# Patient Record
Sex: Female | Born: 1975 | Race: White | Hispanic: No | Marital: Married | Smoking: Never smoker
Health system: Southern US, Community
[De-identification: ages and names within clinical notes are randomized; demographics above are authoritative.]

## PROBLEM LIST (undated history)

## (undated) DIAGNOSIS — Z9889 Other specified postprocedural states: Secondary | ICD-10-CM

## (undated) DIAGNOSIS — F419 Anxiety disorder, unspecified: Secondary | ICD-10-CM

## (undated) DIAGNOSIS — K219 Gastro-esophageal reflux disease without esophagitis: Secondary | ICD-10-CM

## (undated) DIAGNOSIS — F329 Major depressive disorder, single episode, unspecified: Secondary | ICD-10-CM

## (undated) DIAGNOSIS — F32A Depression, unspecified: Secondary | ICD-10-CM

## (undated) DIAGNOSIS — E079 Disorder of thyroid, unspecified: Secondary | ICD-10-CM

## (undated) DIAGNOSIS — R112 Nausea with vomiting, unspecified: Secondary | ICD-10-CM

## (undated) DIAGNOSIS — I10 Essential (primary) hypertension: Secondary | ICD-10-CM

## (undated) HISTORY — DX: Essential (primary) hypertension: I10

## (undated) HISTORY — DX: Gastro-esophageal reflux disease without esophagitis: K21.9

## (undated) HISTORY — PX: ABDOMINAL HYSTERECTOMY: SHX81

## (undated) HISTORY — PX: RECONSTRUCTION ADBOMINAL WALL: SUR1075

---

## 1998-12-30 ENCOUNTER — Other Ambulatory Visit: Admission: RE | Admit: 1998-12-30 | Discharge: 1998-12-30 | Payer: Self-pay | Admitting: *Deleted

## 1999-12-22 ENCOUNTER — Other Ambulatory Visit: Admission: RE | Admit: 1999-12-22 | Discharge: 1999-12-22 | Payer: Self-pay | Admitting: *Deleted

## 2001-02-10 ENCOUNTER — Encounter: Payer: Self-pay | Admitting: Family Medicine

## 2001-02-10 ENCOUNTER — Ambulatory Visit (HOSPITAL_COMMUNITY): Admission: RE | Admit: 2001-02-10 | Discharge: 2001-02-10 | Payer: Self-pay | Admitting: Family Medicine

## 2001-11-25 ENCOUNTER — Other Ambulatory Visit: Admission: RE | Admit: 2001-11-25 | Discharge: 2001-11-25 | Payer: Self-pay | Admitting: Obstetrics and Gynecology

## 2002-06-25 ENCOUNTER — Inpatient Hospital Stay (HOSPITAL_COMMUNITY): Admission: AD | Admit: 2002-06-25 | Discharge: 2002-06-28 | Payer: Self-pay | Admitting: Obstetrics and Gynecology

## 2002-06-25 ENCOUNTER — Encounter (INDEPENDENT_AMBULATORY_CARE_PROVIDER_SITE_OTHER): Payer: Self-pay | Admitting: *Deleted

## 2002-08-11 ENCOUNTER — Other Ambulatory Visit: Admission: RE | Admit: 2002-08-11 | Discharge: 2002-08-11 | Payer: Self-pay | Admitting: Obstetrics and Gynecology

## 2003-05-11 ENCOUNTER — Ambulatory Visit (HOSPITAL_COMMUNITY): Admission: RE | Admit: 2003-05-11 | Discharge: 2003-05-11 | Payer: Self-pay | Admitting: Family Medicine

## 2005-11-26 ENCOUNTER — Ambulatory Visit (HOSPITAL_COMMUNITY): Admission: RE | Admit: 2005-11-26 | Discharge: 2005-11-26 | Payer: Self-pay | Admitting: Family Medicine

## 2005-12-06 ENCOUNTER — Ambulatory Visit: Payer: Self-pay | Admitting: Orthopedic Surgery

## 2005-12-31 ENCOUNTER — Ambulatory Visit: Payer: Self-pay | Admitting: Orthopedic Surgery

## 2008-10-02 ENCOUNTER — Emergency Department (HOSPITAL_COMMUNITY): Admission: EM | Admit: 2008-10-02 | Discharge: 2008-10-02 | Payer: Self-pay | Admitting: Emergency Medicine

## 2009-08-10 ENCOUNTER — Ambulatory Visit (HOSPITAL_COMMUNITY): Admission: RE | Admit: 2009-08-10 | Discharge: 2009-08-11 | Payer: Self-pay | Admitting: Obstetrics and Gynecology

## 2009-08-10 ENCOUNTER — Encounter (INDEPENDENT_AMBULATORY_CARE_PROVIDER_SITE_OTHER): Payer: Self-pay | Admitting: Obstetrics and Gynecology

## 2010-03-17 LAB — CBC
HCT: 29.4 % — ABNORMAL LOW (ref 36.0–46.0)
HCT: 37.9 % (ref 36.0–46.0)
MCH: 26.9 pg (ref 26.0–34.0)
MCH: 27.6 pg (ref 26.0–34.0)
MCHC: 33.1 g/dL (ref 30.0–36.0)
MCV: 81.5 fL (ref 78.0–100.0)
Platelets: 218 10*3/uL (ref 150–400)
Platelets: 231 10*3/uL (ref 150–400)
RBC: 3.65 MIL/uL — ABNORMAL LOW (ref 3.87–5.11)
RBC: 4.65 MIL/uL (ref 3.87–5.11)
RDW: 16.2 % — ABNORMAL HIGH (ref 11.5–15.5)
RDW: 16.3 % — ABNORMAL HIGH (ref 11.5–15.5)

## 2010-04-06 LAB — URINALYSIS, ROUTINE W REFLEX MICROSCOPIC
Glucose, UA: NEGATIVE mg/dL
Hgb urine dipstick: NEGATIVE
Ketones, ur: NEGATIVE mg/dL

## 2010-04-06 LAB — POCT I-STAT, CHEM 8
Calcium, Ion: 1.1 mmol/L — ABNORMAL LOW (ref 1.12–1.32)
Chloride: 111 mEq/L (ref 96–112)
HCT: 40 % (ref 36.0–46.0)
Potassium: 4 mEq/L (ref 3.5–5.1)

## 2010-04-06 LAB — URINE CULTURE

## 2010-05-19 NOTE — H&P (Signed)
   NAME:  ERNESTO, LASHWAY                      ACCOUNT NO.:  [id number]   MEDICAL RECORD NO.:  [id number]                   PATIENT TYPE:  MAT   LOCATION:  [facility]                                 FACILITY:  [facility]   PHYSICIAN:  Lenoard Aden, M.D.             DATE OF BIRTH:  July 02, 1975   DATE OF ADMISSION:  06/25/2002  DATE OF DISCHARGE:                                HISTORY & PHYSICAL   CHIEF COMPLAINT:  1. Active labor.  2. Previous cesarean section x2 .   HISTORY OF PRESENT ILLNESS:  A 35 year old white female, G3, P2, EDD of July 06, 2002, at 38+ weeks in active labor with occasional variable  decelerations.   PAST MEDICAL HISTORY:  1. History of C-section x2.  2. Herniated disk with left leg numbness at L5 per patient report in 1998.   FAMILY HISTORY:  Anemia, UTI, and heart disease.   PRENATAL LABORATORY DATA:  Blood type A negative.  Rubella immune.  Hepatitis and HIV are negative.   PHYSICAL EXAMINATION:  GENERAL APPEARANCE:  She is a well-developed, well-  nourished, white female in a moderate amount of distress with her  contractions.  HEENT:  Normal.  LUNGS:  Clear.  HEART:  Regular rate and rhythm.  ABDOMEN:  Soft, gravid, and nontender.  PELVIC:  The cervix is now closed, 90% vertex, and -1.  EXTREMITIES:  No cords.  NEUROLOGIC:  Exam is nonfocal.   LABORATORY DATA:  NST tracing reveals fetal heart rates in the 140-150 beats  per minute range with accelerations and occasional variable decelerations  down to 90-100 are noted with contractions.   IMPRESSION:  1. A 38-week intrauterine pregnancy.  2. Previous cesarean sections x2 in early labor.  3. Intermittent variable decelerations.    PLAN:  Proceed with repeat C-section and tubal ligation.  The risks of  anesthesia, infection, bleeding, injury to abdominal organs, and need for  repair were discussed.  The failure risk of tubal ligation 5-10 per 1000  noted.  The patient acknowledges and wishes to  proceed.                                               Lenoard Aden, M.D.    RJT/MEDQ  D:  06/25/2002  T:  06/25/2002  Job:  [id number]

## 2010-05-19 NOTE — Op Note (Signed)
NAME:  RAYELLE, ARMOR                      ACCOUNT NO.:  [id number]   MEDICAL RECORD NO.:  [id number]                   PATIENT TYPE:  INP   LOCATION:  9133                                 FACILITY:  [facility]   PHYSICIAN:  Lenoard Aden, M.D.             DATE OF BIRTH:  [birth year]   DATE OF PROCEDURE:  06/25/2002  DATE OF DISCHARGE:                                 OPERATIVE REPORT   CHIEF COMPLAINT:  1. Previous cesarean section x2 in active labor.  2. Desire for elective sterilization.   POSTOPERATIVE DIAGNOSES:  1. Previous cesarean section x2 in active labor.  2. Desire for elective sterilization.   PROCEDURES:  1. Repeat low segment transverse cesarean section.  2. Bilateral partial salpingectomy.   SURGEON:  Lenoard Aden, M.D.   ANESTHESIA:  Spinal by Belva Agee, M.D.   ESTIMATED BLOOD LOSS:  700 mL.   IV FLUIDS:  1400 mL.   URINE OUTPUT:  200 mL clear.   INSTRUMENT COUNT:  Count.   DISPOSITION:  The patient in recovery in good condition.   FINDINGS:  Normal tubes, normal ovaries.  Full-term living female, Apgars of  9 and 9, 6 pounds 5 ounces.  Peds in attendance.   SPECIMENS:  Tubal segments x2 to pathology.   BRIEF OPERATIVE NOTE:  After being apprised of the risks of anesthesia,  infection, bleeding, injury to abdominal organs, need for repair, failure  risk of tubal ligation 5-10 per 1,000,the patient was brought to the  operating room where she was administered spinal anesthetic without  complications, prepped and draped in usual sterile fashion, a Foley catheter  placed.  After achieving adequate anesthesia, a dilute Marcaine solution was  placed in the area.  A Pfannenstiel skin incision was then made with a  scalpel, carried down to the fascia which was nicked in the midline and open  transversely using Mayo scissors.  Rectus muscles dissected sharply in the  midline.  The peritoneum entered sharply, a bladder blade placed.  The  visceral peritoneum was scored in a smile-like fashion.  The uterus was  scored in a smile-like fashion.  Amniotomy revealed clear fluid.  Atraumatic  delivery of full-term living female, 6 pounds and 9 ounces, handed to the  pediatricians in attendance, Apgars of 8 and 9.  The uterus is exteriorized.  The placenta delivered manually intact.  A three-vessel cord noted.  The  uterus curetted using a dry lap pack and found to be within normal limits.  At this time, normal tubes and ovaries had been previously noted.  Uterine  incision closed in one layer using 0 Monocryl in continuous running fashion.  The left tube traced out to the fimbriated end and __________ isthmic  portion of the tube is identified.  Avascular portion of the mesosalpinx is  cauterized creating a window whereby proximal and distal segments are ties  using the 0 plain suture.  Tubal  segment is excised.  Same procedure that is  done on the left tube is done on the right tube.  Both tubal segments sent  to pathology for confirmation.  Good hemostasis is noted.  Tubal lumens are  cauterized.  Visual inspection of the uterine incision finds good hemostasis  with an additional suture placed at the right lateral margin.  The bladder  flap is clear and well dissected off the lower uterine segment.  Paracolic  gutters irrigated and all blood clots subsequently removed.  The fascia then  closed using 0 Vicryl in continuous running fashion.  The skin closed using  staples.  The patient tolerated the procedure well and was transferred to  recovery in good condition.                                               Lenoard Aden, M.D.    RJT/MEDQ  D:  06/25/2002  T:  06/27/2002  Job:  [id number]

## 2010-05-19 NOTE — Discharge Summary (Signed)
   NAME:  Makayla Wilson, Makayla Wilson                      ACCOUNT NO.:  [id number]   MEDICAL RECORD NO.:  [id number]                   PATIENT TYPE:  INP   LOCATION:  9133                                 FACILITY:  [facility]   PHYSICIAN:  Lenoard Aden, M.D.             DATE OF BIRTH:  April 26, 1975   DATE OF ADMISSION:  06/25/2002  DATE OF DISCHARGE:  06/28/2002                                 DISCHARGE SUMMARY   DISCHARGE SUMMARY:  The patient underwent an uncomplicated repeat cesarean  section.  The postoperative course was uncomplicated.  Discharged to home on  day #3 on Tylox, Citracal and Motrin.  Discharge teaching done.  Follow up  in the office in 4-6 weeks.                                                Lenoard Aden, M.D.    RJT/MEDQ  D:  07/26/2002  T:  07/26/2002  Job:  [id number]

## 2012-04-29 ENCOUNTER — Other Ambulatory Visit (HOSPITAL_COMMUNITY): Payer: Self-pay | Admitting: Internal Medicine

## 2012-04-29 ENCOUNTER — Ambulatory Visit (HOSPITAL_COMMUNITY)
Admission: RE | Admit: 2012-04-29 | Discharge: 2012-04-29 | Disposition: A | Discharge status: Home / Self Care | Payer: BC Managed Care – PPO | Source: Ambulatory Visit | Attending: Internal Medicine | Admitting: Internal Medicine

## 2012-04-29 DIAGNOSIS — S9030XA Contusion of unspecified foot, initial encounter: Secondary | ICD-10-CM | POA: Insufficient documentation

## 2012-04-29 DIAGNOSIS — M79609 Pain in unspecified limb: Secondary | ICD-10-CM | POA: Insufficient documentation

## 2012-04-29 DIAGNOSIS — X58XXXA Exposure to other specified factors, initial encounter: Secondary | ICD-10-CM | POA: Insufficient documentation

## 2012-04-29 DIAGNOSIS — T1490XA Injury, unspecified, initial encounter: Secondary | ICD-10-CM

## 2012-08-20 ENCOUNTER — Other Ambulatory Visit (HOSPITAL_COMMUNITY): Payer: Self-pay | Admitting: Internal Medicine

## 2012-08-20 ENCOUNTER — Ambulatory Visit (HOSPITAL_COMMUNITY)
Admission: RE | Admit: 2012-08-20 | Discharge: 2012-08-20 | Disposition: A | Discharge status: Home / Self Care | Payer: BC Managed Care – PPO | Source: Ambulatory Visit | Attending: Internal Medicine | Admitting: Internal Medicine

## 2012-08-20 DIAGNOSIS — W19XXXA Unspecified fall, initial encounter: Secondary | ICD-10-CM

## 2012-08-20 DIAGNOSIS — M79609 Pain in unspecified limb: Secondary | ICD-10-CM | POA: Insufficient documentation

## 2013-01-08 ENCOUNTER — Emergency Department (HOSPITAL_COMMUNITY): Payer: BC Managed Care – PPO

## 2013-01-08 ENCOUNTER — Encounter (HOSPITAL_COMMUNITY): Payer: Self-pay | Admitting: Emergency Medicine

## 2013-01-08 ENCOUNTER — Emergency Department (HOSPITAL_COMMUNITY)
Admission: EM | Admit: 2013-01-08 | Discharge: 2013-01-08 | Disposition: A | Discharge status: Home / Self Care | Payer: BC Managed Care – PPO | Attending: Emergency Medicine | Admitting: Emergency Medicine

## 2013-01-08 DIAGNOSIS — R079 Chest pain, unspecified: Secondary | ICD-10-CM

## 2013-01-08 DIAGNOSIS — Z8659 Personal history of other mental and behavioral disorders: Secondary | ICD-10-CM | POA: Insufficient documentation

## 2013-01-08 DIAGNOSIS — I10 Essential (primary) hypertension: Secondary | ICD-10-CM | POA: Insufficient documentation

## 2013-01-08 DIAGNOSIS — R0602 Shortness of breath: Secondary | ICD-10-CM | POA: Insufficient documentation

## 2013-01-08 DIAGNOSIS — R110 Nausea: Secondary | ICD-10-CM | POA: Insufficient documentation

## 2013-01-08 DIAGNOSIS — R071 Chest pain on breathing: Secondary | ICD-10-CM | POA: Insufficient documentation

## 2013-01-08 HISTORY — DX: Anxiety disorder, unspecified: F41.9

## 2013-01-08 HISTORY — DX: Depression, unspecified: F32.A

## 2013-01-08 HISTORY — DX: Major depressive disorder, single episode, unspecified: F32.9

## 2013-01-08 LAB — CBC
HCT: 42.2 % (ref 36.0–46.0)
Hemoglobin: 14.5 g/dL (ref 12.0–15.0)
MCH: 30.4 pg (ref 26.0–34.0)
MCHC: 34.4 g/dL (ref 30.0–36.0)
MCV: 88.5 fL (ref 78.0–100.0)
Platelets: 229 10*3/uL (ref 150–400)
RBC: 4.77 MIL/uL (ref 3.87–5.11)
RDW: 13 % (ref 11.5–15.5)
WBC: 8.5 10*3/uL (ref 4.0–10.5)

## 2013-01-08 LAB — BASIC METABOLIC PANEL
BUN: 11 mg/dL (ref 6–23)
CALCIUM: 9.9 mg/dL (ref 8.4–10.5)
CO2: 27 mEq/L (ref 19–32)
Chloride: 92 mEq/L — ABNORMAL LOW (ref 96–112)
Creatinine, Ser: 0.69 mg/dL (ref 0.50–1.10)
GFR calc non Af Amer: >90 mL/min (ref >90)
GLUCOSE: 95 mg/dL (ref 70–99)
Potassium: 3.2 mEq/L — ABNORMAL LOW (ref 3.7–5.3)
Sodium: 133 mEq/L — ABNORMAL LOW (ref 137–147)

## 2013-01-08 LAB — TROPONIN I

## 2013-01-08 MED ORDER — SUCRALFATE 1 G PO TABS: 1.0000 g | ORAL_TABLET | Freq: Four times a day (QID) | ORAL | Status: DC

## 2013-01-08 MED ORDER — GI COCKTAIL ~~LOC~~
30.0000 mL | Freq: Once | ORAL | Status: AC
  Administered 2013-01-08: 30 mL via ORAL

## 2013-01-08 MED ORDER — OMEPRAZOLE 20 MG PO CPDR: 20.0000 mg | DELAYED_RELEASE_CAPSULE | Freq: Every day | ORAL | Status: DC

## 2013-01-08 MED ORDER — FENTANYL CITRATE 0.05 MG/ML IJ SOLN
50.0000 ug | Freq: Once | INTRAMUSCULAR | Status: AC
  Administered 2013-01-08: 50 ug via INTRAVENOUS
  Filled 2013-01-08: qty 2

## 2013-01-08 MED ORDER — KETOROLAC TROMETHAMINE 30 MG/ML IJ SOLN
30.0000 mg | Freq: Once | INTRAMUSCULAR | Status: AC
  Administered 2013-01-08: 30 mg via INTRAVENOUS

## 2013-01-08 MED ORDER — FAMOTIDINE IN NACL 20-0.9 MG/50ML-% IV SOLN
20.0000 mg | Freq: Once | INTRAVENOUS | Status: AC
  Administered 2013-01-08: 20 mg via INTRAVENOUS

## 2013-01-08 MED ORDER — SODIUM CHLORIDE 0.9 % IV SOLN
Freq: Once | INTRAVENOUS | Status: AC
  Administered 2013-01-08: 18:00:00 via INTRAVENOUS

## 2013-01-08 NOTE — ED Provider Notes (Signed)
CSN: [id number]     Arrival date & time 01/08/13  1712 History  This chart was scribed for Gerhard Munchobert Rodderick Holtzer, MD by Dorothey Basemania Sutton, ED Scribe. This patient was seen in room APA02/APA02 and the patient's care was started at 5:37 PM.    Chief Complaint  Patient presents with  . Chest Pain   The history is provided by the patient. No language interpreter was used.   HPI Comments: Makayla Wilson is a 38 y.o. female who presents to the Emergency Department complaining of sudden onset chest pain that extends from the epigastric region up to her throat around 2.5 hours ago while she was at work. She states that the chest pain has not changed since onset and denies any exacerbating or alleviating factors. She reports some associated nausea and mild shortness of breath. She denies emesis, rash, abdominal pain, fever. Patient has a history of HTN and anxiety. Patient reports a familial history of MI. Patient is a non-smoker.  Past Medical History  Diagnosis Date  . Hypertension   . Anxiety   . Depression    Past Surgical History  Procedure Laterality Date  . Cesarean section    . Abdominal hysterectomy     History reviewed. No pertinent family history. History  Substance Use Topics  . Smoking status: Never Smoker   . Smokeless tobacco: Not on file  . Alcohol Use: Yes   OB History   Grav Para Term Preterm Abortions TAB SAB Ect Mult Living                 Review of Systems  Constitutional: Negative for fever.       Per HPI, otherwise negative  HENT:       Per HPI, otherwise negative  Respiratory: Positive for shortness of breath.        Per HPI, otherwise negative  Cardiovascular: Positive for chest pain.       Per HPI, otherwise negative  Gastrointestinal: Positive for nausea. Negative for vomiting and abdominal pain.  Endocrine:       Negative aside from HPI  Genitourinary:       Neg aside from HPI   Musculoskeletal:       Per HPI, otherwise negative  Skin: Negative.  Negative  for rash.  Neurological: Negative for syncope.    Allergies  Morphine and related  Home Medications  No current outpatient prescriptions on file.  Triage Vitals: BP 119/73  Pulse 105  Temp(Src) 97.3 F (36.3 C) (Oral)  Resp 18  Ht 5\' 1"  (1.549 m)  Wt 155 lb (70.308 kg)  BMI 29.30 kg/m2  SpO2 100%  Physical Exam  Nursing note and vitals reviewed. Constitutional: She is oriented to person, place, and time. She appears well-developed and well-nourished. No distress.  HENT:  Head: Normocephalic and atraumatic.  Eyes: Conjunctivae and EOM are normal.  Cardiovascular: Normal rate, regular rhythm and normal heart sounds.   Pulmonary/Chest: Effort normal and breath sounds normal. No stridor. No respiratory distress. She exhibits tenderness.  Reproducible tenderness to palpation to chest wall.   Abdominal: Soft. She exhibits no distension. There is no tenderness.  Musculoskeletal: She exhibits no edema.  Neurological: She is alert and oriented to person, place, and time. No cranial nerve deficit.  Skin: Skin is warm and dry.  Psychiatric: She has a normal mood and affect.    ED Course  Procedures (including critical care time)  DIAGNOSTIC STUDIES: Oxygen Saturation is 100% on room air, normal by my  interpretation.    COORDINATION OF CARE: 5:40 PM- Ordered blood labs and a chest x-ray. Will order IV fluids, Toradol, Pepcid, and a GI cocktail to manage symptoms. Discussed treatment plan with patient at bedside and patient verbalized agreement.   6:33 PM- Patient reports that the pain has somewhat improved, but is still intermittent. Will order Sumblimaze to manage symptoms. Will order an additional troponin. Discussed that preliminary lab and imaging results were normal. Discussed treatment plan with patient at bedside and patient verbalized agreement.   10:01 PM- Discussed that EKG and lab results were normal. Will discharge patient with medication to manage symptoms. Discussed  that the patient is relatively low risk for cardiac problems at this time. Advised patient to follow up with her PCP for further evaluation. Discussed treatment plan with patient at bedside and patient verbalized agreement.   Labs Review Labs Reviewed  BASIC METABOLIC PANEL - Abnormal; Notable for the following:    Sodium 133 (*)    Potassium 3.2 (*)    Chloride 92 (*)    All other components within normal limits  CBC  TROPONIN I  TROPONIN I   Imaging Review Dg Chest 2 View  01/08/2013   CLINICAL DATA:  Chest pain.  EXAM: CHEST  2 VIEW  COMPARISON:  None.  FINDINGS: The heart size and mediastinal contours are within normal limits. Both lungs are clear. The visualized skeletal structures are unremarkable.  IMPRESSION: No active cardiopulmonary disease.   Electronically Signed   By: Salome Holmes M.D.   On: 01/08/2013 17:58    EKG Interpretation    Date/Time:  Thursday January 08 2013 17:18:34 EST Ventricular Rate:  94 PR Interval:  122 QRS Duration: 94 QT Interval:  370 QTC Calculation: 462 R Axis:   51 Text Interpretation:  Normal sinus rhythm Normal ECG No previous ECGs available Sinus rhythm Normal ECG Confirmed by Gerhard Munch  MD ([id number]) on 01/08/2013 5:25:30 PM           EKG Interpretation    Date/Time:  Thursday January 08 2013 21:20:46 EST Ventricular Rate:  68 PR Interval:  128 QRS Duration: 92 QT Interval:  440 QTC Calculation: 467 R Axis:   61 Text Interpretation:  Normal sinus rhythm Normal ECG When compared with ECG of 08-Jan-2013 17:18, No significant change was found Sinus rhythm No significant change since last tracing Normal ECG Confirmed by Gerhard Munch  MD ([id number]) on 01/08/2013 10:12:31 PM            MDM   1. Chest pain    patient presents with pain, nausea.  Patient's description of pain from the epigastrium radiating superiorly, the absence of EKG changes, the serially negative troponins, and her generally low risk profile are all  reassuring for the low suspicion of ongoing coronary ischemia. Patient had pain resolution here.  She is appropriate for discharge with close outpatient followup.    Gerhard Munch, MD 01/08/13 2214

## 2013-01-08 NOTE — Discharge Instructions (Signed)
As discussed, it is important that you follow up as soon as possible with your physician for continued management of your condition. ° °If you develop any new, or concerning changes in your condition, please return to the emergency department immediately. ° °Chest Pain (Nonspecific) °It is often hard to give a specific diagnosis for the cause of chest pain. There is always a chance that your pain could be related to something serious, such as a heart attack or a blood clot in the lungs. You need to follow up with your caregiver for further evaluation. °CAUSES  °· Heartburn. °· Pneumonia or bronchitis. °· Anxiety or stress. °· Inflammation around your heart (pericarditis) or lung (pleuritis or pleurisy). °· A blood clot in the lung. °· A collapsed lung (pneumothorax). It can develop suddenly on its own (spontaneous pneumothorax) or from injury (trauma) to the chest. °· Shingles infection (herpes zoster virus). °The chest wall is composed of bones, muscles, and cartilage. Any of these can be the source of the pain. °· The bones can be bruised by injury. °· The muscles or cartilage can be strained by coughing or overwork. °· The cartilage can be affected by inflammation and become sore (costochondritis). °DIAGNOSIS  °Lab tests or other studies, such as X-rays, electrocardiography, stress testing, or cardiac imaging, may be needed to find the cause of your pain.  °TREATMENT  °· Treatment depends on what may be causing your chest pain. Treatment may include: °· Acid blockers for heartburn. °· Anti-inflammatory medicine. °· Pain medicine for inflammatory conditions. °· Antibiotics if an infection is present. °· You may be advised to change lifestyle habits. This includes stopping smoking and avoiding alcohol, caffeine, and chocolate. °· You may be advised to keep your head raised (elevated) when sleeping. This reduces the chance of acid going backward from your stomach into your esophagus. °· Most of the time, nonspecific  chest pain will improve within 2 to 3 days with rest and mild pain medicine. °HOME CARE INSTRUCTIONS  °· If antibiotics were prescribed, take your antibiotics as directed. Finish them even if you start to feel better. °· For the next few days, avoid physical activities that bring on chest pain. Continue physical activities as directed. °· Do not smoke. °· Avoid drinking alcohol. °· Only take over-the-counter or prescription medicine for pain, discomfort, or fever as directed by your caregiver. °· Follow your caregiver's suggestions for further testing if your chest pain does not go away. °· Keep any follow-up appointments you made. If you do not go to an appointment, you could develop lasting (chronic) problems with pain. If there is any problem keeping an appointment, you must call to reschedule. °SEEK MEDICAL CARE IF:  °· You think you are having problems from the medicine you are taking. Read your medicine instructions carefully. °· Your chest pain does not go away, even after treatment. °· You develop a rash with blisters on your chest. °SEEK IMMEDIATE MEDICAL CARE IF:  °· You have increased chest pain or pain that spreads to your arm, neck, jaw, back, or abdomen. °· You develop shortness of breath, an increasing cough, or you are coughing up blood. °· You have severe back or abdominal pain, feel nauseous, or vomit. °· You develop severe weakness, fainting, or chills. °· You have a fever. °THIS IS AN EMERGENCY. Do not wait to see if the pain will go away. Get medical help at once. Call your local emergency services (911 in [place] Do not drive yourself to the hospital. °  MAKE SURE YOU:  °· Understand these instructions. °· Will watch your condition. °· Will get help right away if you are not doing well or get worse. °Document Released: 09/27/2004 Document Revised: 03/12/2011 Document Reviewed: 07/24/2007 °[employer]® Patient Information ©2014 [employer]. ° °

## 2013-01-08 NOTE — ED Notes (Signed)
Chest pain, onset 3 pm, nausea, and sob,

## 2013-02-05 DIAGNOSIS — I10 Essential (primary) hypertension: Secondary | ICD-10-CM

## 2013-02-05 DIAGNOSIS — K224 Dyskinesia of esophagus: Secondary | ICD-10-CM

## 2013-02-05 DIAGNOSIS — F32A Depression, unspecified: Secondary | ICD-10-CM | POA: Insufficient documentation

## 2013-02-05 DIAGNOSIS — F3289 Other specified depressive episodes: Secondary | ICD-10-CM

## 2013-02-05 DIAGNOSIS — F329 Major depressive disorder, single episode, unspecified: Secondary | ICD-10-CM

## 2013-02-05 DIAGNOSIS — K219 Gastro-esophageal reflux disease without esophagitis: Secondary | ICD-10-CM

## 2013-02-05 DIAGNOSIS — E669 Obesity, unspecified: Secondary | ICD-10-CM

## 2013-02-06 ENCOUNTER — Encounter: Payer: Self-pay | Admitting: Cardiology

## 2013-02-06 ENCOUNTER — Ambulatory Visit (INDEPENDENT_AMBULATORY_CARE_PROVIDER_SITE_OTHER): Payer: BC Managed Care – PPO | Admitting: Cardiology

## 2013-02-06 VITALS — BP 130/82 | HR 81 | Ht 61.0 in | Wt 167.0 lb

## 2013-02-06 DIAGNOSIS — K219 Gastro-esophageal reflux disease without esophagitis: Secondary | ICD-10-CM

## 2013-02-06 DIAGNOSIS — R072 Precordial pain: Secondary | ICD-10-CM | POA: Insufficient documentation

## 2013-02-06 DIAGNOSIS — I10 Essential (primary) hypertension: Secondary | ICD-10-CM

## 2013-02-06 NOTE — Assessment & Plan Note (Signed)
Potential diagnosis. She is on a proton pump inhibitor and Carafate.

## 2013-02-06 NOTE — Patient Instructions (Addendum)
Your physician recommends that you schedule a follow-up appointment to be determined.we will call you with test results.   Your physician has requested that you have a stress echocardiogram. For further information please visit [email or web address]. Please follow instruction sheet as given.

## 2013-02-06 NOTE — Progress Notes (Signed)
Clinical Summary Makayla Wilson is a 38 y.o.female referred for cardiology consultation by Dr. Margo Aye. She was seen in the ER back in January with chest pain, ultimately discharged to followup with her primary care provider.  Lab work from January showed normal troponin I, hemoglobin 14.5, platelets 229, potassium 3.2, BUN 11, creatinine 0.6. ECG from that time showed normal sinus rhythm. Chest x-ray demonstrated no active cardiac pulmonary disease.  She is here with her husband today. She reports a 6-12 month history of intermittent chest tightness. She states these episodes have become somewhat more intense and frequent in the last 6 months. These or not exertional typically, more sporadic, can last for several minutes or even longer. She has taken one of her mother's nitroglycerin pills on one occasion and did note some improvement. Does not endorse any regular reflux symptoms or dysphasia. She states she is active, has been trying to exercise and watch her diet given family history of premature CAD in both parents.  She does have a history of hypertension, reports compliance with her medication.  She has not undergone any prior ischemic cardiac testing.  Allergies  Allergen Reactions  . Morphine And Related     Current Outpatient Prescriptions  Medication Sig Dispense Refill  . cephALEXin (KEFLEX) 500 MG capsule Take 500 mg by mouth 3 (three) times daily.      Marland Kitchen escitalopram (LEXAPRO) 20 MG tablet Take 20 mg by mouth daily.      Marland Kitchen olmesartan-hydrochlorothiazide (BENICAR HCT) 20-12.5 MG per tablet Take 1 tablet by mouth daily.      . Omega-3 Fatty Acids (FISH OIL) 1000 MG CAPS Take 1,000 mg by mouth daily.      Marland Kitchen omeprazole (PRILOSEC) 20 MG capsule Take 1 capsule (20 mg total) by mouth daily.  30 capsule  0  . sucralfate (CARAFATE) 1 G tablet Take 1 tablet (1 g total) by mouth 4 (four) times daily.  28 tablet  0   No current facility-administered medications for this visit.    Past  Medical History  Diagnosis Date  . Essential hypertension, benign   . Anxiety   . Depression   . GERD (gastroesophageal reflux disease)     Past Surgical History  Procedure Laterality Date  . Cesarean section    . Abdominal hysterectomy      Family History  Problem Relation Age of Onset  . CAD Father     Premature  . CAD Mother     Premature    Social History Makayla Wilson reports that she has never smoked. She does not have any smokeless tobacco history on file. Makayla Wilson reports that she drinks alcohol.  Review of Systems No palpitations or syncope. She enjoys scuba diving, sometimes feels dizzy when she dies. Bilateral knee pain sometimes with exercise, no claudication. Otherwise negative.  Physical Examination Filed Vitals:   02/06/13 1326  BP: 130/82  Pulse: 81   Filed Weights   02/06/13 1326  Weight: 167 lb (75.751 kg)   Patient appears comfortable at rest. HEENT: Conjunctiva and lids normal, oropharynx clear with moist mucosa. Neck: Supple, no elevated JVP or carotid bruits, no thyromegaly. Lungs: Clear to auscultation, nonlabored breathing at rest. Cardiac: Regular rate and rhythm, no S3 or significant systolic murmur, no pericardial rub. Extremities: No pitting edema, distal pulses 2+. Skin: Warm and dry. Musculoskeletal: No kyphosis. Neuropsychiatric: Alert and oriented x3, affect grossly appropriate.   Problem List and Plan   Precordial pain Typical and atypical features.  Cardiac risk factors include personal history of hypertension, also significant family history of premature CAD in both parents. She reports generally favorable lipid panel, no medical therapy for hyperlipidemia. Baseline ECG is normal. Recent lab work reviewed as well. Plan will be to proceed with exercise echocardiogram for further evaluation. We will inform her of the results.  GERD (gastroesophageal reflux disease) Potential diagnosis. She is on a proton pump inhibitor and  Carafate.  Essential hypertension, benign Reports compliance with antihypertensive treatment, also working on diet and exercise.    Jonelle SidleSamuel G. Antonino Nienhuis, M.D., F.A.C.C.

## 2013-02-06 NOTE — Assessment & Plan Note (Signed)
Reports compliance with antihypertensive treatment, also working on diet and exercise.

## 2013-02-06 NOTE — Assessment & Plan Note (Signed)
Typical and atypical features. Cardiac risk factors include personal history of hypertension, also significant family history of premature CAD in both parents. She reports generally favorable lipid panel, no medical therapy for hyperlipidemia. Baseline ECG is normal. Recent lab work reviewed as well. Plan will be to proceed with exercise echocardiogram for further evaluation. We will inform her of the results.

## 2013-02-12 ENCOUNTER — Encounter (HOSPITAL_COMMUNITY): Payer: Self-pay | Admitting: Pharmacy Technician

## 2013-02-12 ENCOUNTER — Encounter (HOSPITAL_COMMUNITY): Payer: Self-pay

## 2013-02-12 ENCOUNTER — Encounter (HOSPITAL_COMMUNITY)
Admission: RE | Admit: 2013-02-12 | Discharge: 2013-02-12 | Disposition: A | Discharge status: Home / Self Care | Payer: BC Managed Care – PPO | Source: Ambulatory Visit | Attending: General Surgery | Admitting: General Surgery

## 2013-02-12 LAB — BASIC METABOLIC PANEL
BUN: 12 mg/dL (ref 6–23)
CALCIUM: 9.6 mg/dL (ref 8.4–10.5)
CO2: 29 mEq/L (ref 19–32)
Chloride: 98 mEq/L (ref 96–112)
Creatinine, Ser: 0.73 mg/dL (ref 0.50–1.10)
Glucose, Bld: 84 mg/dL (ref 70–99)
Potassium: 3.8 mEq/L (ref 3.7–5.3)
Sodium: 140 mEq/L (ref 137–147)

## 2013-02-12 LAB — CBC WITH DIFFERENTIAL/PLATELET
BASOS ABS: 0 10*3/uL (ref 0.0–0.1)
Basophils Relative: 1 % (ref 0–1)
EOS ABS: 0.2 10*3/uL (ref 0.0–0.7)
Eosinophils Relative: 4 % (ref 0–5)
HCT: 41.7 % (ref 36.0–46.0)
Hemoglobin: 14.4 g/dL (ref 12.0–15.0)
Lymphocytes Relative: 33 % (ref 12–46)
Lymphs Abs: 2.2 10*3/uL (ref 0.7–4.0)
MCH: 31 pg (ref 26.0–34.0)
MCHC: 34.5 g/dL (ref 30.0–36.0)
MCV: 89.7 fL (ref 78.0–100.0)
Monocytes Absolute: 0.3 10*3/uL (ref 0.1–1.0)
Monocytes Relative: 5 % (ref 3–12)
NEUTROS ABS: 3.9 10*3/uL (ref 1.7–7.7)
Neutrophils Relative %: 59 % (ref 43–77)
PLATELETS: 266 10*3/uL (ref 150–400)
RBC: 4.65 MIL/uL (ref 3.87–5.11)
RDW: 13.1 % (ref 11.5–15.5)
WBC: 6.6 10*3/uL (ref 4.0–10.5)

## 2013-02-12 NOTE — H&P (Signed)
  NTS SOAP Note  Vital Signs:  Vitals as of: 02/12/2013: Systolic 106: Diastolic 70: Heart Rate 78: Temp 99.29F: Height 655ft 1in: Weight 159Lbs 0 Ounces: Pain Level 8: BMI 30.04  BMI : 30.04 kg/m2  Subjective: This 38 Years 38 Months old Female presents for of a sebaceous cyst on abdominal wall.  Has been present for one months.  Some drainage noted, but  pain and swelling persists despite being on antibiotic.  Review of Symptoms:  Constitutional:  fever,chills    headache :   Nose/Mouth/Throat:No nasal congestion, rhinorrhea, oral lesions, postnasal drip or sore throat  Cardiovascular:  unremarkable   Respiratory:unremarkable   Gastrointestin    abdominal pain,nausea Genitourinary:unremarkable       joint pain boils Hematolgic/Lymphatic:unremarkable    ...   hay fever   Past Medical History:    Reviewed  Past Medical History  Surgical History: c sections, abdominoplasty, hysterectomy Medical Problems: HTN, depression Allergies: morphine, j and j bandaids Medications: lexapro, benecar, pepcid, bactrum   Social History:Reviewed  Social History  Preferred Language: English Race:  White Ethnicity: Not Hispanic / Latino Age: 38 Years 4 Months Marital Status:  M Alcohol: socially Recreational drug(s): no   Smoking Status: Never smoker reviewed on 02/12/2013 Functional Status reviewed on 02/12/2013 ------------------------------------------------ Bathing: Normal Cooking: Normal Dressing: Normal Driving: Normal Eating: Normal Managing Meds: Normal Oral Care: Normal Shopping: Normal Toileting: Normal Transferring: Normal Walking: Normal Cognitive Status reviewed on 02/12/2013 ------------------------------------------------ Attention: Normal Decision Making: Normal Language: Normal Memory: Normal Motor: Normal Perception: Normal Problem Solving: Normal Visual and Spatial: Normal   Family History:   Reviewed  Family Health History Mother, Living; Diabetes mellitus, unspecified type; Heart disease;  Father, Deceased; Heart disease;     Objective Information: General:  Well appearing, well nourished in no distress. Heart:  RRR, no murmur Lungs:    CTA bilaterally, no wheezes, rhonchi, rales.  Breathing unlabored. Abdomen:Soft, NT/ND, no HSM, no masses.  2.5cm inflamed cyst on left upper abdominal wall.  Punctum present.  Erythematous.  Assessment:Sebaceous cyst abdominal wall, inflamed  Diagnoses: 706.2 Epidermoid cyst of skin (Sebaceous cyst)  Procedures: [id number] - OFFICE OUTPATIENT NEW 30 MINUTES    Plan:  Scheduled for excision of sebaceous cyst, abdominal wall on 02/16/13.   Patient Education:Alternative treatments to surgery were discussed with patient (and family).  Risks and benefits  of procedure were fully explained to the patient (and family) who gave informed consent. Patient/family questions were addressed.  Follow-up:Pending Surgery

## 2013-02-12 NOTE — Patient Instructions (Addendum)
Makayla SloughMelissa F Wilson  02/12/2013   Your procedure is scheduled on:   02/16/2013  Report to [facility] at  725  AM.  Call this number if you have problems the morning of surgery: [phone]   Remember:   Do not eat food or drink liquids after midnight.   Take these medicines the morning of surgery with A SIP OF WATER: lexapro, benicar, prilosec   Do not wear jewelry, make-up or nail polish.  Do not wear lotions, powders, or perfumes.   Do not shave 48 hours prior to surgery. Men may shave face and neck.  Do not bring valuables to the hospital.  [facility] is not responsible  for any belongings or valuables.               Contacts, dentures or bridgework may not be worn into surgery.  Leave suitcase in the car. After surgery it may be brought to your room.  For patients admitted to the hospital, discharge time is determined by your treatment team.               Patients discharged the day of surgery will not be allowed to drive home.  Name and phone number of your driver: family  Special Instructions: Shower using CHG 2 nights before surgery and the night before surgery.  If you shower the day of surgery use CHG.  Use special wash - you have one bottle of CHG for all showers.  You should use approximately 1/3 of the bottle for each shower.   Please read over the following fact sheets that you were given: Pain Booklet, Coughing and Deep Breathing, Surgical Site Infection Prevention, Anesthesia Post-op Instructions and Care and Recovery After Surgery Cyst Removal Your caregiver has removed a cyst. A cyst is a sac containing a semi-solid material. Cysts may occur any place on your body. They may remain small for years or gradually get larger. A sebaceous cyst is an enlarged (dilated) sweat gland filled with old sweat (sebum). Unattended, these may become large (the size of a softball) over several years time. These are often removed for improved appearance (cosmetic) reasons or before  they become infected to form an abscess. An abscess is an infected cyst. HOME CARE INSTRUCTIONS   Keep your bandage clean and dry. You may change your bandage after 24 hours. If your bandage sticks, use warm water to gently loosen it. Pat the area dry with a clean towel before putting on another bandage.  If possible, keep the area where the cyst was removed raised to relieve soreness, swelling, and promote healing.  If you have stitches, keep them clean and dry.  You may clean your stitches gently with a cotton swab dipped in warm soapy water.  Do not soak the area where the cyst was removed or go swimming. You may shower.  Do not overuse the area where your cyst was removed.  Return in 7 days or as directed to have your stitches removed.  Take medicines as instructed by your caregiver. SEEK IMMEDIATE MEDICAL CARE IF:   An oral temperature above 102 F (38.9 C) develops, not controlled by medication.  Blood continues to soak through the bandage.  You have increasing pain in the area where your cyst was removed.  You have redness, swelling, pus, a bad smell, soreness (inflammation), or red streaks coming away from the stitches. These are signs of infection. MAKE SURE YOU:   Understand these instructions.  Will  watch your condition.  Will get help right away if you are not doing well or get worse. Document Released: 12/16/1999 Document Revised: 03/12/2011 Document Reviewed: 04/10/2007 [facility] Patient Information 2014 [place], [place]. PATIENT INSTRUCTIONS POST-ANESTHESIA  IMMEDIATELY FOLLOWING SURGERY:  Do not drive or operate machinery for the first twenty four hours after surgery.  Do not make any important decisions for twenty four hours after surgery or while taking narcotic pain medications or sedatives.  If you develop intractable nausea and vomiting or a severe headache please notify your doctor immediately.  FOLLOW-UP:  Please make an appointment with your surgeon as  instructed. You do not need to follow up with anesthesia unless specifically instructed to do so.  WOUND CARE INSTRUCTIONS (if applicable):  Keep a dry clean dressing on the anesthesia/puncture wound site if there is drainage.  Once the wound has quit draining you may leave it open to air.  Generally you should leave the bandage intact for twenty four hours unless there is drainage.  If the epidural site drains for more than 36-48 hours please call the anesthesia department.  QUESTIONS?:  Please feel free to call your physician or the hospital operator if you have any questions, and they will be happy to assist you.

## 2013-02-13 ENCOUNTER — Encounter (HOSPITAL_COMMUNITY): Payer: Self-pay

## 2013-02-13 ENCOUNTER — Ambulatory Visit (HOSPITAL_COMMUNITY)
Admission: RE | Admit: 2013-02-13 | Discharge: 2013-02-13 | Disposition: A | Discharge status: Home / Self Care | Payer: BC Managed Care – PPO | Source: Ambulatory Visit | Attending: Cardiology | Admitting: Cardiology

## 2013-02-13 DIAGNOSIS — R072 Precordial pain: Secondary | ICD-10-CM

## 2013-02-13 DIAGNOSIS — I10 Essential (primary) hypertension: Secondary | ICD-10-CM | POA: Insufficient documentation

## 2013-02-13 NOTE — Progress Notes (Signed)
Stress Lab Nurses Notes - Makayla Wilson  Makayla Wilson 02/13/2013 Reason for doing test: Chest Pain Type of test: Stress Echo Nurse performing test: Parke PoissonPhyllis Billingsly, RN Nuclear Medicine Tech: Not Applicable Echo Tech: Nestor RampSharika Roberts MD performing test: P. Nishan/K.Lawrence NP Family MD: Margo AyeHall Test explained and consent signed: yes IV started: No IV started Symptoms: Dizziness & fatigue Treatment/Intervention: None Reason test stopped: fatigue and reached target HR After recovery IV was: NA Patient to return to Nuc. Med at : NA Patient discharged: Home Patient's Condition upon discharge was: stable Comments: During test peak BP 129/68 & HR 171.  Recovery BP 119/69 & HR 102.  Symptoms resolved in recovery. Erskine SpeedBillingsley, Thuan Tippett T

## 2013-02-16 ENCOUNTER — Encounter (HOSPITAL_COMMUNITY): Payer: Self-pay | Admitting: *Deleted

## 2013-02-16 ENCOUNTER — Ambulatory Visit (HOSPITAL_COMMUNITY): Payer: BC Managed Care – PPO | Admitting: Anesthesiology

## 2013-02-16 ENCOUNTER — Ambulatory Visit (HOSPITAL_COMMUNITY)
Admission: RE | Admit: 2013-02-16 | Discharge: 2013-02-16 | Disposition: A | Discharge status: Home / Self Care | Payer: BC Managed Care – PPO | Source: Ambulatory Visit | Attending: General Surgery | Admitting: General Surgery

## 2013-02-16 ENCOUNTER — Encounter (HOSPITAL_COMMUNITY): Payer: BC Managed Care – PPO | Admitting: Anesthesiology

## 2013-02-16 ENCOUNTER — Encounter (HOSPITAL_COMMUNITY)
Admission: RE | Disposition: A | Discharge status: Home / Self Care | Payer: Self-pay | Source: Ambulatory Visit | Attending: General Surgery

## 2013-02-16 DIAGNOSIS — F329 Major depressive disorder, single episode, unspecified: Secondary | ICD-10-CM | POA: Insufficient documentation

## 2013-02-16 DIAGNOSIS — Z79899 Other long term (current) drug therapy: Secondary | ICD-10-CM | POA: Insufficient documentation

## 2013-02-16 DIAGNOSIS — F3289 Other specified depressive episodes: Secondary | ICD-10-CM | POA: Insufficient documentation

## 2013-02-16 DIAGNOSIS — L723 Sebaceous cyst: Secondary | ICD-10-CM | POA: Insufficient documentation

## 2013-02-16 DIAGNOSIS — I10 Essential (primary) hypertension: Secondary | ICD-10-CM | POA: Insufficient documentation

## 2013-02-16 DIAGNOSIS — Z6830 Body mass index (BMI) 30.0-30.9, adult: Secondary | ICD-10-CM | POA: Insufficient documentation

## 2013-02-16 HISTORY — DX: Nausea with vomiting, unspecified: R11.2

## 2013-02-16 HISTORY — DX: Other specified postprocedural states: Z98.890

## 2013-02-16 HISTORY — PX: CYST REMOVAL TRUNK: SHX6283

## 2013-02-16 SURGERY — CYST REMOVAL TRUNK
Anesthesia: General | Site: Abdomen

## 2013-02-16 MED ORDER — MIDAZOLAM HCL 2 MG/2ML IJ SOLN
INTRAMUSCULAR | Status: AC
  Filled 2013-02-16: qty 2

## 2013-02-16 MED ORDER — DEXAMETHASONE SODIUM PHOSPHATE 4 MG/ML IJ SOLN
4.0000 mg | Freq: Once | INTRAMUSCULAR | Status: AC
  Administered 2013-02-16: 4 mg via INTRAVENOUS

## 2013-02-16 MED ORDER — FENTANYL CITRATE 0.05 MG/ML IJ SOLN: 25.0000 ug | INTRAMUSCULAR | Status: DC | PRN

## 2013-02-16 MED ORDER — PROPOFOL 10 MG/ML IV BOLUS
INTRAVENOUS | Status: DC | PRN
  Administered 2013-02-16: 150 mg via INTRAVENOUS

## 2013-02-16 MED ORDER — ONDANSETRON HCL 4 MG/2ML IJ SOLN
4.0000 mg | Freq: Once | INTRAMUSCULAR | Status: AC
  Administered 2013-02-16: 4 mg via INTRAVENOUS

## 2013-02-16 MED ORDER — FENTANYL CITRATE 0.05 MG/ML IJ SOLN
INTRAMUSCULAR | Status: AC
  Filled 2013-02-16: qty 2

## 2013-02-16 MED ORDER — VANCOMYCIN HCL IN DEXTROSE 1-5 GM/200ML-% IV SOLN
INTRAVENOUS | Status: AC
  Filled 2013-02-16: qty 200

## 2013-02-16 MED ORDER — SCOPOLAMINE 1 MG/3DAYS TD PT72
1.0000 | MEDICATED_PATCH | Freq: Once | TRANSDERMAL | Status: DC
  Administered 2013-02-16: 1.5 mg via TRANSDERMAL

## 2013-02-16 MED ORDER — OXYCODONE-ACETAMINOPHEN 7.5-325 MG PO TABS: 1.0000 | ORAL_TABLET | ORAL | Status: DC | PRN

## 2013-02-16 MED ORDER — SCOPOLAMINE 1 MG/3DAYS TD PT72
MEDICATED_PATCH | TRANSDERMAL | Status: AC
  Filled 2013-02-16: qty 1

## 2013-02-16 MED ORDER — CHLORHEXIDINE GLUCONATE 4 % EX LIQD: 1.0000 "application " | Freq: Once | CUTANEOUS | Status: DC

## 2013-02-16 MED ORDER — DEXTROSE 5 % IV SOLN
INTRAVENOUS | Status: DC | PRN
  Administered 2013-02-16: 09:00:00 via INTRAVENOUS

## 2013-02-16 MED ORDER — GLYCOPYRROLATE 0.2 MG/ML IJ SOLN
0.2000 mg | Freq: Once | INTRAMUSCULAR | Status: AC
  Administered 2013-02-16: 0.2 mg via INTRAVENOUS
  Filled 2013-02-16: qty 1

## 2013-02-16 MED ORDER — MIDAZOLAM HCL 2 MG/2ML IJ SOLN
1.0000 mg | INTRAMUSCULAR | Status: DC | PRN
  Administered 2013-02-16 (×2): 2 mg via INTRAVENOUS
  Filled 2013-02-16: qty 2

## 2013-02-16 MED ORDER — FENTANYL CITRATE 0.05 MG/ML IJ SOLN
INTRAMUSCULAR | Status: DC | PRN
Start: 2013-02-16 — End: 2013-02-16
  Administered 2013-02-16 (×2): 25 ug via INTRAVENOUS

## 2013-02-16 MED ORDER — LIDOCAINE HCL (PF) 1 % IJ SOLN
INTRAMUSCULAR | Status: AC
  Filled 2013-02-16: qty 5

## 2013-02-16 MED ORDER — ONDANSETRON HCL 4 MG/2ML IJ SOLN
INTRAMUSCULAR | Status: AC
  Filled 2013-02-16: qty 2

## 2013-02-16 MED ORDER — MIDAZOLAM HCL 5 MG/5ML IJ SOLN
INTRAMUSCULAR | Status: DC | PRN
  Administered 2013-02-16: 2 mg via INTRAVENOUS

## 2013-02-16 MED ORDER — KETOROLAC TROMETHAMINE 30 MG/ML IJ SOLN
30.0000 mg | Freq: Once | INTRAMUSCULAR | Status: AC
  Administered 2013-02-16: 30 mg via INTRAVENOUS
  Filled 2013-02-16: qty 1

## 2013-02-16 MED ORDER — DEXAMETHASONE SODIUM PHOSPHATE 4 MG/ML IJ SOLN
INTRAMUSCULAR | Status: AC
  Filled 2013-02-16: qty 1

## 2013-02-16 MED ORDER — VANCOMYCIN HCL IN DEXTROSE 1-5 GM/200ML-% IV SOLN
1000.0000 mg | INTRAVENOUS | Status: AC
  Administered 2013-02-16: 1000 mg via INTRAVENOUS

## 2013-02-16 MED ORDER — LACTATED RINGERS IV SOLN
INTRAVENOUS | Status: DC
  Administered 2013-02-16: 09:00:00 via INTRAVENOUS

## 2013-02-16 MED ORDER — PROPOFOL 10 MG/ML IV EMUL
INTRAVENOUS | Status: AC
  Filled 2013-02-16: qty 20

## 2013-02-16 MED ORDER — SODIUM CHLORIDE 0.9 % IR SOLN
Status: DC | PRN
  Administered 2013-02-16: 1000 mL

## 2013-02-16 MED ORDER — ONDANSETRON HCL 4 MG/2ML IJ SOLN: 4.0000 mg | Freq: Once | INTRAMUSCULAR | Status: DC | PRN

## 2013-02-16 MED ORDER — BUPIVACAINE HCL (PF) 0.5 % IJ SOLN
INTRAMUSCULAR | Status: AC
  Filled 2013-02-16: qty 30

## 2013-02-16 MED ORDER — LIDOCAINE HCL 1 % IJ SOLN
INTRAMUSCULAR | Status: DC | PRN
  Administered 2013-02-16: 50 mg via INTRADERMAL

## 2013-02-16 MED ORDER — FENTANYL CITRATE 0.05 MG/ML IJ SOLN
25.0000 ug | INTRAMUSCULAR | Status: AC
  Administered 2013-02-16 (×2): 25 ug via INTRAVENOUS

## 2013-02-16 MED ORDER — BUPIVACAINE HCL 0.5 % IJ SOLN
INTRAMUSCULAR | Status: DC | PRN
  Administered 2013-02-16: 8 mL

## 2013-02-16 SURGICAL SUPPLY — 34 items
ADH SKN CLS APL DERMABOND .7 (GAUZE/BANDAGES/DRESSINGS) ×1
BAG HAMPER (MISCELLANEOUS) ×2 IMPLANT
CLOTH BEACON ORANGE TIMEOUT ST (SAFETY) ×2 IMPLANT
COVER LIGHT HANDLE STERIS (MISCELLANEOUS) ×4 IMPLANT
DECANTER SPIKE VIAL GLASS SM (MISCELLANEOUS) ×2 IMPLANT
DERMABOND ADVANCED (GAUZE/BANDAGES/DRESSINGS) ×1
DERMABOND ADVANCED .7 DNX12 (GAUZE/BANDAGES/DRESSINGS) IMPLANT
DURAPREP 26ML APPLICATOR (WOUND CARE) ×1 IMPLANT
ELECT NDL TIP 2.8 STRL (NEEDLE) IMPLANT
ELECT NEEDLE TIP 2.8 STRL (NEEDLE) IMPLANT
ELECT REM PT RETURN 9FT ADLT (ELECTROSURGICAL) ×2
ELECTRODE REM PT RTRN 9FT ADLT (ELECTROSURGICAL) ×1 IMPLANT
FORMALIN 10 PREFIL 120ML (MISCELLANEOUS) ×2 IMPLANT
GLOVE BIO SURGEON STRL SZ7.5 (GLOVE) ×2 IMPLANT
GLOVE INDICATOR 7.0 STRL GRN (GLOVE) ×1 IMPLANT
GLOVE INDICATOR 7.5 STRL GRN (GLOVE) ×1 IMPLANT
GLOVE SS BIOGEL STRL SZ 6.5 (GLOVE) IMPLANT
GLOVE SUPERSENSE BIOGEL SZ 6.5 (GLOVE) ×1
GOWN STRL REUS W/TWL LRG LVL3 (GOWN DISPOSABLE) ×5 IMPLANT
KIT ROOM TURNOVER APOR (KITS) ×2 IMPLANT
MANIFOLD NEPTUNE II (INSTRUMENTS) ×2 IMPLANT
NDL HYPO 25X1 1.5 SAFETY (NEEDLE) ×1 IMPLANT
NEEDLE HYPO 25X1 1.5 SAFETY (NEEDLE) ×2 IMPLANT
NS IRRIG 1000ML POUR BTL (IV SOLUTION) ×2 IMPLANT
PACK MINOR (CUSTOM PROCEDURE TRAY) ×1 IMPLANT
PAD ARMBOARD 7.5X6 YLW CONV (MISCELLANEOUS) ×2 IMPLANT
SET BASIN LINEN APH (SET/KITS/TRAYS/PACK) ×2 IMPLANT
SOL PREP PROV IODINE SCRUB 4OZ (MISCELLANEOUS) ×1 IMPLANT
SUT ETHILON 3 0 FSL (SUTURE) IMPLANT
SUT PROLENE 4 0 PS 2 18 (SUTURE) IMPLANT
SUT VIC AB 3-0 SH 27 (SUTURE)
SUT VIC AB 3-0 SH 27X BRD (SUTURE) IMPLANT
SUT VIC AB 4-0 PS2 27 (SUTURE) ×1 IMPLANT
SYR CONTROL 10ML LL (SYRINGE) ×2 IMPLANT

## 2013-02-16 NOTE — Op Note (Signed)
Patient:  Makayla SloughMelissa F Wilson  DOB:  12-19-1975  MRN:  [id number]   Preop Diagnosis:  Epidermoid cyst, abdominal wall  Postop Diagnosis:  Same  Procedure:  Excision of sebaceous cyst, abdominal wall  Surgeon:  Franky MachoMark Pallas Wahlert, M.D.  Anes:  General  Indications:  Patient is a 38 year old white female presents with an infected sebaceous cyst of the abdominal wall which has been persistent despite drainage and antibiotic therapy. She now presents for formal excision of the cyst. The risks and benefits of the procedure were fully explained to the patient, who gave informed consent.  Procedure note:  The patient was placed in the supine position. After general anesthesia was administered, the abdomen was prepped and draped using usual sterile technique with Betadine. Surgical site confirmation was performed.  An elliptical incision was made around the cyst which measured approximately 2 cm in diameter. Dissection was taken down to the subcutaneous tissue. The cyst was excised without difficulty and sent to pathology for further examination. Anty bleeding was controlled using Bovie electrocautery. 0.5% Sensorcaine was instilled the surrounding wound. The skin edges were reapproximated using a 4-0 Vicryl subcuticular suture. Dermabond was then applied.  All tape and needle counts were correct at the end of the procedure. The patient was awakened and transferred to PACU in stable condition.  Complications:  None  EBL:  Minimal  Specimen:  Sebaceous cyst, abdominal wall

## 2013-02-16 NOTE — Transfer of Care (Signed)
Immediate Anesthesia Transfer of Care Note  Patient: Makayla Wilson  Procedure(s) Performed: Procedure(s): EXCISION SEBACEOUS CYST ABDOMINAL WALL (N/A)  Patient Location: PACU  Anesthesia Type:General  Level of Consciousness: awake, alert  and patient cooperative  Airway & Oxygen Therapy: Patient Spontanous Breathing and Patient connected to face mask oxygen  Post-op Assessment: Report given to PACU RN, Post -op Vital signs reviewed and stable and Patient moving all extremities  Post vital signs: Reviewed and stable  Complications: No apparent anesthesia complications

## 2013-02-16 NOTE — Discharge Instructions (Signed)
Epidermal Cyst  An epidermal cyst is usually a small, painless lump under the skin. Cysts often occur on the face, neck, stomach, chest, or genitals. The cyst may be filled with a bad smelling paste. Do not pop your cyst. Popping the cyst can cause pain and puffiness (swelling).  HOME CARE   · Only take medicines as told by your doctor.  · Take your medicine (antibiotics) as told. Finish it even if you start to feel better.  GET HELP RIGHT AWAY IF:  · Your cyst is tender, red, or puffy.  · You are not getting better, or you are getting worse.  · You have any questions or concerns.  MAKE SURE YOU:  · Understand these instructions.  · Will watch your condition.  · Will get help right away if you are not doing well or get worse.  Document Released: 01/26/2004 Document Revised: 06/19/2011 Document Reviewed: 06/26/2010  ExitCare® Patient Information ©2014 [employer].

## 2013-02-16 NOTE — Interval H&P Note (Signed)
History and Physical Interval Note:  02/16/2013 7:58 AM  Makayla Wilson  has presented today for surgery, with the diagnosis of sebaceous cyst abdominal wall  The various methods of treatment have been discussed with the patient and family. After consideration of risks, benefits and other options for treatment, the patient has consented to  Procedure(s): EXCISION SEBACEOUS CYST ABDOMINAL WALL (N/A) as a surgical intervention .  The patient's history has been reviewed, patient examined, no change in status, stable for surgery.  I have reviewed the patient's chart and labs.  Questions were answered to the patient's satisfaction.     Franky MachoJENKINS,Samel Bruna A

## 2013-02-16 NOTE — Anesthesia Preprocedure Evaluation (Signed)
Anesthesia Evaluation  Patient identified by MRN, date of birth, ID band Patient awake    Reviewed: Allergy & Precautions, H&P , NPO status , Patient's Chart, lab work & pertinent test results  History of Anesthesia Complications (+) PONV and history of anesthetic complications  Airway Mallampati: III TM Distance: <3 FB Neck ROM: Full    Dental  (+) Teeth Intact, Dental Advisory Given   Pulmonary neg pulmonary ROS,  breath sounds clear to auscultation        Cardiovascular hypertension, Pt. on medications Rhythm:Regular Rate:Normal     Neuro/Psych PSYCHIATRIC DISORDERS Anxiety Depression    GI/Hepatic GERD-  Medicated and Controlled,  Endo/Other    Renal/GU      Musculoskeletal   Abdominal   Peds  Hematology   Anesthesia Other Findings   Reproductive/Obstetrics                           Anesthesia Physical Anesthesia Plan  ASA: II  Anesthesia Plan: General   Post-op Pain Management:    Induction: Intravenous  Airway Management Planned: LMA  Additional Equipment:   Intra-op Plan:   Post-operative Plan: Extubation in OR  Informed Consent: I have reviewed the patients History and Physical, chart, labs and discussed the procedure including the risks, benefits and alternatives for the proposed anesthesia with the patient or authorized representative who has indicated his/her understanding and acceptance.     Plan Discussed with:   Anesthesia Plan Comments:         Anesthesia Quick Evaluation

## 2013-02-16 NOTE — Anesthesia Postprocedure Evaluation (Signed)
  Anesthesia Post-op Note  Patient: Makayla Wilson  Procedure(s) Performed: Procedure(s): EXCISION SEBACEOUS CYST ABDOMINAL WALL (N/A)  Patient Location: PACU  Anesthesia Type:General  Level of Consciousness: awake, alert , oriented and patient cooperative  Airway and Oxygen Therapy: Patient Spontanous Breathing  Post-op Pain: 3 /10, mild  Post-op Assessment: Post-op Vital signs reviewed, Patient's Cardiovascular Status Stable, Respiratory Function Stable, Patent Airway, No signs of Nausea or vomiting and Pain level controlled  Post-op Vital Signs: Reviewed and stable  Complications: No apparent anesthesia complications

## 2013-02-16 NOTE — Anesthesia Procedure Notes (Signed)
Procedure Name: LMA Insertion Date/Time: 02/16/2013 9:31 AM Performed by: Despina HiddenIDACAVAGE, Nhyira Leano J Pre-anesthesia Checklist: Emergency Drugs available, Suction available, Patient being monitored and Patient identified Patient Re-evaluated:Patient Re-evaluated prior to inductionOxygen Delivery Method: Circle system utilized Preoxygenation: Pre-oxygenation with 100% oxygen Intubation Type: IV induction Ventilation: Mask ventilation without difficulty LMA: LMA inserted LMA Size: 3.0 Tube type: Oral Number of attempts: 1 Placement Confirmation: positive ETCO2 and breath sounds checked- equal and bilateral Tube secured with: Tape Dental Injury: Teeth and Oropharynx as per pre-operative assessment

## 2013-02-17 ENCOUNTER — Encounter (HOSPITAL_COMMUNITY): Payer: Self-pay | Admitting: General Surgery

## 2013-03-31 ENCOUNTER — Encounter (HOSPITAL_COMMUNITY): Payer: Self-pay | Admitting: Psychiatry

## 2013-03-31 ENCOUNTER — Ambulatory Visit (INDEPENDENT_AMBULATORY_CARE_PROVIDER_SITE_OTHER): Payer: BC Managed Care – PPO | Admitting: Psychiatry

## 2013-03-31 DIAGNOSIS — F329 Major depressive disorder, single episode, unspecified: Secondary | ICD-10-CM

## 2013-03-31 DIAGNOSIS — F32A Depression, unspecified: Secondary | ICD-10-CM

## 2013-03-31 DIAGNOSIS — F3289 Other specified depressive episodes: Secondary | ICD-10-CM

## 2013-04-01 NOTE — Progress Notes (Signed)
Patient:   Makayla Wilson   DOB:   05-Jul-1975  MR Number:  [id number]  Location:  [address], [place], [place] [address]  Date of Service:   Tuesday 03/31/2013  Start Time:   11:10 AM End Time:   12:05 PM  Provider/Observer:  Makayla Wilson, MSW, LCSW   Billing Code/Service:  [phone]  Chief Complaint:     Chief Complaint  Patient presents with  . Anxiety  . Depression    Reason for Service:  Patient is seeking services due to to experiencing symptoms of depression and anxiety. Per patient's report, she learned her 39 year old daughter is gay and her 67 year old daughter sent a text informing her that she no longer is interested in cheerleading and transferring to another college this past weekend. Patient states she just snapped and has been crying non-stop for three days. She has no appetite and states just mainly drinking water. She reports she didn't  work yesterday and that she had a mental breakdown this morning being on the verge of tears during conversation with a co-worker.  Patient is distraught as she blames self for 51 year old daughter being gay. She reports homeschooling her 3 year old daughter this academic year  so that she would have more time to pursue her dream of dancing and prepare to apply to [employer]. Her daughter's dance instructor  is gay and patient thinks she has influenced daughter. She is disappointed in 93- year-old daughter as daughter has always had the goal of transferring to another college to do cheerleading. Patient states feeling as though money has been wasted as she and husband have made significant financial investment in activities for both of their daughters in pursuing their dreams. Patient reports initially beginning to experience symptoms of depression and anxiety when her father died about 8-1/2 years ago. Patient reports father became sick at home and she performed CPR until EMS arrived. However, her father did not survive.  Patient reports she continues to have nightmares regarding this. She has unresolved grief and loss issues as she reports being very close with  father and states he was her rock. She reports additional stress related to her mother and siblings as she states they have made poor financial decisions and often complain about each other to patient.  Current Status:  Patient reports depressed mood, crying spells, anxiety, loss of appetite, difficulty falling and staying asleep (2-3 hours of sleep per night), memory difficulty, loss of interest in activities, irritability, excessive worrying, low-energy, poor concentration, loss of libido, and memory difficulty.  Reliability of Information: Information gathered from patient.  Behavioral Observation: Makayla Wilson  presents as a 37 y.o.-year-old Right -handed Caucasian Female who appeared her stated age. Her dress was appropriate and she was Disheveled. Her manners were appropriate to the situation.  There were not any physical disabilities noted.  She displayed an appropriate level of cooperation and motivation.    Interactions:    Active   Attention:   within normal limits  Memory:   Impaired immediate memory - recalled 1/3 words  Visuo-spatial:   not examined  Speech (Volume):  normal  Speech:   normal pitch and normal volume  Thought Process:  Coherent and Relevant  Though Content:  WNL  Orientation:   person, place, time/date, situation, day of week, month of year and year  Judgment:   Good  Planning:   Fair  Affect:    Anxious, Depressed and Tearful  Mood:  Anxious and Depressed  Insight:   Fair  Intelligence:   normal  Marital Status/Living: The patient was born and reared in [place], [place]. She is the youngest of 3 siblings. She reports mom and siblings picked on her a lot during childhood and says this is the way they show there love. She reports having a very close relationship with her father and says he  was her rock. Patient has been married for 20 years. She and her husband have 3 daughters, ages 7110, 8116, and 6018. Patient and her family reside in [place]. Patient's normal interests include diving.  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, [employer], and [employer].  Substance Use:  No concerns of substance abuse are reported.   Education:   Patient obtained ArboriculturistN degree at [employer].  Medical History:   Past Medical History  Diagnosis Date  . Essential hypertension, benign   . Anxiety   . Depression   . GERD (gastroesophageal reflux disease)   . PONV (postoperative nausea and vomiting)     Sexual History:   History  Sexual Activity  . Sexual Activity: Yes  . Birth Control/ Protection: Surgical    Abuse/Trauma History: Denies  Psychiatric History:  Patient reports no psychiatric hospitalizations or previous involvement in outpatient psychotherapy. She reports she began taking Lexapro many years ago as prescribed by her primary care physician. She currently is taking 20 mg of Lexapro.  Family Med/Psych History:  Family History  Problem Relation Age of Onset  . CAD Father     Premature  . Anxiety disorder Father   . CAD Mother     Premature  . Depression Other     Risk of Suicide/Violence: Patient denies any suicidal attempts. She admits to feeling hopeless but denies active suicidal ideations, any plan, and any intent. She denies past and current homicidal ideations. She reports no self injurious behaviors, aggression, or violence  Impression/DX:  Patient presents with symptoms of anxiety and depression that initially began when her father died about 8-1/2 years ago after becoming ill at home where patient gave him CPR. She continues to experience nightmares regarding  this. Symptoms appear to have been exacerbated this past weekend after learning one of her daughters is gay and another daughter has decided not to pursue previously agreed upon goals. Patient states having a mental breakdown and currently is experiencing  depressed mood, crying spells, anxiety, loss of appetite, difficulty falling and staying asleep (2-3 hours of sleep per night), memory difficulty, loss of interest in activities, irritability, excessive worrying, low-energy, poor concentration, loss of libido, and memory difficulty.  Patient has been taking Lexapro for several years. Diagnosis:  depressive disorder, rule out major depressive disorder  Disposition/Plan:  Patient attends the assessment appointment today. Confidentiality and limits are discussed. The patient agrees to return for an appointment in one to 2 weeks for continuing assessment and treatment planning. Patient also agrees to see psychiatrist Dr. Tenny Crawoss for medication evaluation. Patient agrees to call this practice, call 911, or have someone take her to the emergency room should symptoms worsen.  Diagnosis:    Axis I:  Depressive disorder      Axis II: Deferred       Axis III:  See medical history      Axis IV:  problems with  primary support group          Axis V:  41-50 serious symptoms          BYNUM,PEGGY, LCSW

## 2013-04-01 NOTE — Patient Instructions (Signed)
Discussed orally 

## 2013-04-02 ENCOUNTER — Ambulatory Visit (INDEPENDENT_AMBULATORY_CARE_PROVIDER_SITE_OTHER): Payer: BC Managed Care – PPO | Admitting: Psychiatry

## 2013-04-02 ENCOUNTER — Encounter (HOSPITAL_COMMUNITY): Payer: Self-pay | Admitting: Psychiatry

## 2013-04-02 VITALS — BP 110/80 | Ht 61.0 in | Wt 161.0 lb

## 2013-04-02 DIAGNOSIS — F329 Major depressive disorder, single episode, unspecified: Secondary | ICD-10-CM

## 2013-04-02 DIAGNOSIS — F39 Unspecified mood [affective] disorder: Secondary | ICD-10-CM

## 2013-04-02 DIAGNOSIS — F32A Depression, unspecified: Secondary | ICD-10-CM

## 2013-04-02 DIAGNOSIS — F411 Generalized anxiety disorder: Secondary | ICD-10-CM

## 2013-04-02 MED ORDER — CLONAZEPAM 0.5 MG PO TABS: 0.5000 mg | ORAL_TABLET | Freq: Every day | ORAL | Status: DC

## 2013-04-02 NOTE — Progress Notes (Signed)
Psychiatric Assessment Adult  Patient Identification:  Makayla Wilson Date of Evaluation:  04/02/2013 Chief Complaint: I had a Meltdown this week" History of Chief Complaint:   Chief Complaint  Patient presents with  . Anxiety  . Depression  . Establish Care    Anxiety Symptoms include nervous/anxious behavior.     this patient is a 38 year old married white female who lives with her husband and 3 girls ages 15,16 and 42 in 89. 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  She states that she's always been a worrying type person. She is the youngest of 3 children and she is always been the most responsible child in the family. Her father took care of everyone in the family but he died 10 years ago and she had to do CPR to try to revive him. She still somewhat blames herself for his death.  The patient states that her mother brother and sister all live very close to her. They depend on her great deal as does her grandmother. Her mother has several health problems and she won't get medical care. The patient worries a good deal about her kids as well. Her 64 year old has always excelled at cheerleading and is currently at [employer]. She plans to transfer  to [place] on a cheerleading scholarship. However she told the patient this past weekend that she doesn't want to do this anymore. Her middle daughter is a Advertising account planner and is very serious about it. She recently told her mother that she might be gay.  The patient has been on Lexapro for several years but recently lost the bottle. She states this might of contributed to the "meltdown" she had a few days ago after she found out about the news from both daughters. She was crying and somewhat hysterical which is very unlike her. She's always very controlled and contained. Is very hard for her to say no to anyone in the family and she puts herself last. She's been crying a  lot more, nervous and anxious having occasional panic attacks. She denies suicidal ideation. She does not have any psychotic symptoms and does not use drugs or alcohol. She always sleeps 3 or 4 hours per night   Review of Systems  Constitutional: Negative.   HENT: Negative.   Eyes: Negative.   Respiratory: Negative.   Cardiovascular: Negative.   Gastrointestinal: Negative.   Endocrine: Negative.   Genitourinary: Negative.   Musculoskeletal: Negative.   Allergic/Immunologic: Negative.   Neurological: Negative.   Hematological: Negative.   Psychiatric/Behavioral: Positive for sleep disturbance and dysphoric mood. The patient is nervous/anxious.    Physical Exam not done  Depressive Symptoms: depressed mood, anhedonia, feelings of worthlessness/guilt, anxiety, panic attacks, insomnia,  (Hypo) Manic Symptoms:   Elevated Mood:  No Irritable Mood:  Yes Grandiosity:  No Distractibility:  No Labiality of Mood:  No Delusions:  No Hallucinations:  No Impulsivity:  No Sexually Inappropriate Behavior:  No Financial Extravagance:  No Flight of Ideas:  No  Anxiety Symptoms: Excessive Worry:  Yes Panic Symptoms:  Yes Agoraphobia:  No Obsessive Compulsive: No  Symptoms: None, Specific Phobias:  No Social Anxiety:  No  Psychotic Symptoms:  Hallucinations: No None Delusions:  No Paranoia:  No   Ideas of Reference:  No  PTSD Symptoms: Ever had a traumatic exposure:  No Had a traumatic exposure in the last month:  No Re-experiencing: No None Hypervigilance:  No Hyperarousal:  No None Avoidance: No None  Traumatic Brain Injury: No   Past Psychiatric History: Diagnosis: Depression   Hospitalizations: None   Outpatient Care: Only through primary care providers   Substance Abuse Care: none  Self-Mutilation: none  Suicidal Attempts: none  Violent Behaviors: none   Past Medical History:   Past Medical History  Diagnosis Date  . Essential hypertension, benign   .  Anxiety   . Depression   . GERD (gastroesophageal reflux disease)   . PONV (postoperative nausea and vomiting)    History of Loss of Consciousness:  No Seizure History:  No Cardiac History:  No Allergies:   Allergies  Allergen Reactions  . Morphine And Related Hives  . Other     Glue on Johnson[employer]Johnson bandaids. Caused severe redness and reaction   Current Medications:  Current Outpatient Prescriptions  Medication Sig Dispense Refill  . escitalopram (LEXAPRO) 20 MG tablet Take 20 mg by mouth daily.      Marland Kitchen. olmesartan-hydrochlorothiazide (BENICAR HCT) 20-12.5 MG per tablet Take 1 tablet by mouth daily.      . Omega-3 Fatty Acids (FISH OIL) 1000 MG CAPS Take 1,000 mg by mouth daily.      . clonazePAM (KLONOPIN) 0.5 MG tablet Take 1 tablet (0.5 mg total) by mouth at bedtime.  30 tablet  2  . ibuprofen (ADVIL,MOTRIN) 400 MG tablet Take 400 mg by mouth every 6 (six) hours as needed for moderate pain.      Marland Kitchen. omeprazole (PRILOSEC) 20 MG capsule Take 1 capsule (20 mg total) by mouth daily.  30 capsule  0  . OVER THE COUNTER MEDICATION Take 1 capsule by mouth daily. OTC biocell      . oxyCODONE-acetaminophen (PERCOCET) 7.5-325 MG per tablet Take 1-2 tablets by mouth every 4 (four) hours as needed.  50 tablet  0  . sucralfate (CARAFATE) 1 G tablet Take 1 g by mouth 4 (four) times daily.      Marland Kitchen. sulfamethoxazole-trimethoprim (BACTRIM DS) 800-160 MG per tablet Take 1 tablet by mouth 2 (two) times daily.       No current facility-administered medications for this visit.    Previous Psychotropic Medications:  Medication Dose   Lexapro   20 mg daily                      Substance Abuse History in the last 12 months: Substance Age of 1st Use Last Use Amount Specific Type  Nicotine      Alcohol      Cannabis      Opiates      Cocaine      Methamphetamines      LSD      Ecstasy      Benzodiazepines      Caffeine      Inhalants      Others:                          Medical  Consequences of Substance Abuse: n/a  Legal Consequences of Substance Abuse: n/a  Family Consequences of Substance Abuse: n/a  Blackouts:  No DT's:  No Withdrawal Symptoms:  No None  Social History: [place] Place [place] Residence: [place] [address] [place] Place of Birth: [place] Family Members: Husband, 3 daughter Marital Status:  Married Children:   Sons:   Daughters: 3 Relationships:  Education:  [employer] Educational Problems/Performance:  Religious Beliefs/Practices: Christian History of Abuse: none Armed forces technical officerccupational Experiences; 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 Legal History: none Hobbies/Interests: Travel, scuba diving  Family History:   Family History  Problem Relation Age of Onset  . CAD Father     Premature  . Anxiety disorder Father   . CAD Mother     Premature  . Depression Other   . Depression Sister     Mental Status Examination/Evaluation: Objective:  Appearance: Casual and Well Groomed  Eye Contact::  Good  Speech:  Clear and Coherent  Volume:  Decreased  Mood:  Anxious and a bit tearful   Affect:  Constricted  Thought Process:  Goal Directed  Orientation:  Full (Time, Place, and Person)  Thought Content:  WDL  Suicidal Thoughts:  No  Homicidal Thoughts:  No  Judgement:  Good  Insight:  Good  Psychomotor Activity:  Normal  Akathisia:  No  Handed:  Right  AIMS (if indicated):    Assets:  Communication Skills Desire for Improvement Talents/Skills    Laboratory/X-Ray Psychological Evaluation(s)        Assessment:  Axis I: Generalized Anxiety Disorder and Mood Disorder NOS  AXIS I Generalized Anxiety Disorder and Mood Disorder NOS  AXIS II Deferred  AXIS III Past Medical History  Diagnosis Date  . Essential hypertension, benign   . Anxiety   . Depression   . GERD (gastroesophageal reflux disease)   . PONV (postoperative nausea and vomiting)      AXIS IV problems with primary support  group  AXIS V 61-70 mild symptoms   Treatment Plan/Recommendations:  Plan of Care: Medication management   Laboratory:    Psychotherapy: She is seeing Florencia Reasons here   Medications: She'll continue Lexapro 20 mg daily. She'll start clonazepam 0.5 mg each bedtime to help with insomnia and anxiety   Routine PRN Medications:  No  Consultations:   Safety Concerns:    Other: She will return in four-weeks     Diannia Ruder, MD 4/2/20153:47 PM

## 2013-04-15 ENCOUNTER — Ambulatory Visit (INDEPENDENT_AMBULATORY_CARE_PROVIDER_SITE_OTHER): Payer: BC Managed Care – PPO | Admitting: Psychiatry

## 2013-04-15 DIAGNOSIS — F329 Major depressive disorder, single episode, unspecified: Secondary | ICD-10-CM

## 2013-04-15 DIAGNOSIS — F32A Depression, unspecified: Secondary | ICD-10-CM

## 2013-04-15 DIAGNOSIS — F3289 Other specified depressive episodes: Secondary | ICD-10-CM

## 2013-04-16 NOTE — Patient Instructions (Signed)
Discussed orally 

## 2013-04-16 NOTE — Progress Notes (Signed)
   THERAPIST PROGRESS NOTE  Session Time: Wednesday 04/15/2013 2:00 PM - 2:55 PM  Participation Level: Active  Behavioral Response:  Well groomed/ AlertAnxious and Depressed  Type of Therapy: Individual Therapy  Treatment Goals addressed: Establish therapeutic alliance, improve self-care, improve ability to manage stress and anxiety  Interventions: Supportive  Summary: Makayla SloughMelissa F Wilson is a 38 y.o. female who presents with symptoms of anxiety and depression that initially began when her father died about 8-1/2 years ago after becoming ill at home where patient gave him CPR. She continues to experience nightmares regarding this. Symptoms appear to have been exacerbated this past weekend after learning one of her daughters is gay and another daughter has decided not to pursue previously agreed upon goals. Patient states having a mental breakdown and currently is experiencing  depressed mood, crying spells, anxiety, loss of appetite, difficulty falling and staying asleep (2-3 hours of sleep per night), memory difficulty, loss of interest in activities, irritability, excessive worrying, low-energy, poor concentration, loss of libido, and memory difficulty.  Since last session 2 weeks ago, patient has began taking medication as prescribed by psychiatrist Dr. Tenny Crawoss. She isn't crying as frequently but reports being very emotional. Continues to experience anxiety but states no longer experiencing the pain. She also constantly worries. She has changed positions on her job which requires patient to go to the office for the next 2 months for training. This has been helpful as patient has been busy. She continues to experience sleep difficulty reporting  3 - 5  hours of sleep per night. She reports setting boundaries with her mother with help from her husband. She expresses disappointment as mother sent a negative message to patient on face book. Patient expresses less frustration and sadness about her  children's choices. Patient reports pattern of being extremely critical of self and having perfectionistic tendencies. She continues to have loss issues about the death of her father as well as becoming disillusioned with her mother and siblings after his death as her family is not what she thought it was. She expresses disappointment that mother and sister have not been more supportive of her current situation. Her brother has tried to be supportive.  Suicidal/Homicidal: No  Therapist Response: Therapist works with patient to process feelings, exploring her thought patterns, identify ways to improve self-care identify relaxation techniques  Plan: Return again in 2 weeks.  Diagnosis: Axis I: Depressive Disorder NOS    Axis II: Deferred    BYNUM,PEGGY, LCSW 04/16/2013

## 2013-04-22 ENCOUNTER — Ambulatory Visit (HOSPITAL_COMMUNITY): Payer: BC Managed Care – PPO | Admitting: Psychiatry

## 2013-04-28 ENCOUNTER — Ambulatory Visit (INDEPENDENT_AMBULATORY_CARE_PROVIDER_SITE_OTHER): Payer: BC Managed Care – PPO | Admitting: Psychiatry

## 2013-04-28 DIAGNOSIS — F3289 Other specified depressive episodes: Secondary | ICD-10-CM

## 2013-04-28 DIAGNOSIS — F329 Major depressive disorder, single episode, unspecified: Secondary | ICD-10-CM

## 2013-04-28 DIAGNOSIS — F32A Depression, unspecified: Secondary | ICD-10-CM

## 2013-04-28 NOTE — Patient Instructions (Signed)
Discussed orally 

## 2013-04-28 NOTE — Progress Notes (Addendum)
   THERAPIST PROGRESS NOTE  Session Time:  Tuesday 04/28/2013 1:05 PM - 1:55 PM  Participation Level: Active  Behavioral Response: Well GroomedAlertAnxious and Depressed  Type of Therapy: Individual Therapy  Treatment Goals addressed:  Improve ability to to mange stress with decreased intensity/ frequency of anxiety response and decreased emotional outbursts      Develop realistic expectations of self and others      Improve mood experiencing increased motivation and resuming normal interest in activities.  Interventions: Supportive  Summary: Makayla SloughMelissa F Wilson is a 38 y.o. female who presents with symptoms of anxiety and depression that initially began when her father died about 8-1/2 years ago after becoming ill at home where patient gave him CPR. She continues to experience nightmares regarding this. Symptoms appear to have been exacerbated this past weekend after learning one of her daughters is gay and another daughter has decided not to pursue previously agreed upon goals. Patient states having a mental breakdown and currently is experiencing  depressed mood, crying spells, anxiety, loss of appetite, difficulty falling and staying asleep (2-3 hours of sleep per night), memory difficulty, loss of interest in activities, irritability, excessive worrying, low-energy, poor concentration, loss of libido, and memory difficulty.  Patient reports little to no change in symptoms since last session. She continues to experience anxiety, excessive worry, sleep difficulty, and increased irritability and agitation. She has had at least 3 emotional outbursts since last session. 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 She states having crying spells but mainly feeling numb and having no joy or zest for life.  She is planning to go to [place] with friends next week and hopes trip will be helpful. Patient reports stress related to increased conflict with sister and brother-in-law. 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     Suicidal/Homicidal: No  Therapist Response: Therapist works with patient to process feelings, develop treatment plan, explore coping and relaxation techniques ( spirituality, support system, meditation)  Plan: Return again in 2 weeks.  Diagnosis: Axis I: Depressive Disorder NOS and Generalized Anxiety Disorder    Axis II: Deferred    Makayla Ayler, LCSW 04/28/2013

## 2013-04-30 ENCOUNTER — Encounter (HOSPITAL_COMMUNITY): Payer: Self-pay | Admitting: Psychiatry

## 2013-04-30 ENCOUNTER — Ambulatory Visit (INDEPENDENT_AMBULATORY_CARE_PROVIDER_SITE_OTHER): Payer: BC Managed Care – PPO | Admitting: Psychiatry

## 2013-04-30 VITALS — BP 100/70 | Ht 61.0 in | Wt 160.0 lb

## 2013-04-30 DIAGNOSIS — F32A Depression, unspecified: Secondary | ICD-10-CM

## 2013-04-30 DIAGNOSIS — F329 Major depressive disorder, single episode, unspecified: Secondary | ICD-10-CM

## 2013-04-30 DIAGNOSIS — F411 Generalized anxiety disorder: Secondary | ICD-10-CM

## 2013-04-30 DIAGNOSIS — F39 Unspecified mood [affective] disorder: Secondary | ICD-10-CM

## 2013-04-30 MED ORDER — ZOLPIDEM TARTRATE ER 12.5 MG PO TBCR: 12.5000 mg | EXTENDED_RELEASE_TABLET | Freq: Every evening | ORAL | Status: DC | PRN

## 2013-04-30 MED ORDER — FLUOXETINE HCL 20 MG PO CAPS: 20.0000 mg | ORAL_CAPSULE | Freq: Every day | ORAL | Status: DC

## 2013-04-30 NOTE — Progress Notes (Signed)
Patient ID: Makayla Wilson, female   DOB: May 02, 1975, 38 y.o.   MRN: [id number]  Psychiatric Assessment Adult  Patient Identification:  Makayla Wilson Date of Evaluation:  04/30/2013 Chief Complaint: I'm still very depressed " History of Chief Complaint:   Chief Complaint  Patient presents with  . Anxiety  . Depression  . Follow-up    Anxiety Symptoms include nervous/anxious behavior.     this patient is a 38 year old married white female who lives with her husband and 3 girls ages 69,16 and 99 in 57. 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  She states that she's always been a worrying type person. She is the youngest of 3 children and she is always been the most responsible child in the family. Her father took care of everyone in the family but he died 10 years ago and she had to do CPR to try to revive him. She still somewhat blames herself for his death.  The patient states that her mother brother and sister all live very close to her. They depend on her great deal as does her grandmother. Her mother has several health problems and she won't get medical care. The patient worries a good deal about her kids as well. Her 28 year old has always excelled at cheerleading and is currently at [employer]. She plans to transfer  to [place] on a cheerleading scholarship. However she told the patient this past weekend that she doesn't want to do this anymore. Her middle daughter is a Advertising account planner and is very serious about it. She recently told her mother that she might be gay.  The patient has been on Lexapro for several years but recently lost the bottle. She states this might of contributed to the "meltdown" she had a few days ago after she found out about the news from both daughters. She was crying and somewhat hysterical which is very unlike her. She's always very controlled and contained. Is very hard for her to  say no to anyone in the family and she puts herself last. She's been crying a lot more, nervous and anxious having occasional panic attacks. She denies suicidal ideation. She does not have any psychotic symptoms and does not use drugs or alcohol. She always sleeps 3 or 4 hours per night  The patient returns after four-week's. She's still not doing much better. She's more depressed and crying irritable and losing her temper. Nothing seems to make her feel happy. She's going on a trip to [place] with her cousin next week and even this doesn't seem to excited her. She denies any suicidal ideation or plan. She still not sleeping well. She slept well in the past with Ambien and we probably need to retry this. Her antidepressant does not seem to be effective   Review of Systems  Constitutional: Negative.   HENT: Negative.   Eyes: Negative.   Respiratory: Negative.   Cardiovascular: Negative.   Gastrointestinal: Negative.   Endocrine: Negative.   Genitourinary: Negative.   Musculoskeletal: Negative.   Allergic/Immunologic: Negative.   Neurological: Negative.   Hematological: Negative.   Psychiatric/Behavioral: Positive for sleep disturbance and dysphoric mood. The patient is nervous/anxious.    Physical Exam not done  Depressive Symptoms: depressed mood, anhedonia, feelings of worthlessness/guilt, anxiety, panic attacks, insomnia,  (Hypo) Manic Symptoms:   Elevated Mood:  No Irritable Mood:  Yes Grandiosity:  No Distractibility:  No Labiality of Mood:  No Delusions:  No Hallucinations:  No Impulsivity:  No Sexually Inappropriate Behavior:  No Financial Extravagance:  No Flight of Ideas:  No  Anxiety Symptoms: Excessive Worry:  Yes Panic Symptoms:  Yes Agoraphobia:  No Obsessive Compulsive: No  Symptoms: None, Specific Phobias:  No Social Anxiety:  No  Psychotic Symptoms:  Hallucinations: No None Delusions:  No Paranoia:  No   Ideas of Reference:  No  PTSD  Symptoms: Ever had a traumatic exposure:  No Had a traumatic exposure in the last month:  No Re-experiencing: No None Hypervigilance:  No Hyperarousal: No None Avoidance: No None  Traumatic Brain Injury: No   Past Psychiatric History: Diagnosis: Depression   Hospitalizations: None   Outpatient Care: Only through primary care providers   Substance Abuse Care: none  Self-Mutilation: none  Suicidal Attempts: none  Violent Behaviors: none   Past Medical History:   Past Medical History  Diagnosis Date  . Essential hypertension, benign   . Anxiety   . Depression   . GERD (gastroesophageal reflux disease)   . PONV (postoperative nausea and vomiting)    History of Loss of Consciousness:  No Seizure History:  No Cardiac History:  No Allergies:   Allergies  Allergen Reactions  . Morphine And Related Hives  . Other     Glue on Johnson&Johnson bandaids. Caused severe redness and reaction   Current Medications:  Current Outpatient Prescriptions  Medication Sig Dispense Refill  . clonazePAM (KLONOPIN) 0.5 MG tablet Take 1 tablet (0.5 mg total) by mouth at bedtime.  30 tablet  2  . FLUoxetine (PROZAC) 20 MG capsule Take 1 capsule (20 mg total) by mouth daily.  30 capsule  2  . ibuprofen (ADVIL,MOTRIN) 400 MG tablet Take 400 mg by mouth every 6 (six) hours as needed for moderate pain.      Marland Kitchen. olmesartan-hydrochlorothiazide (BENICAR HCT) 20-12.5 MG per tablet Take 1 tablet by mouth daily.      . Omega-3 Fatty Acids (FISH OIL) 1000 MG CAPS Take 1,000 mg by mouth daily.      Marland Kitchen. omeprazole (PRILOSEC) 20 MG capsule Take 1 capsule (20 mg total) by mouth daily.  30 capsule  0  . OVER THE COUNTER MEDICATION Take 1 capsule by mouth daily. OTC biocell      . oxyCODONE-acetaminophen (PERCOCET) 7.5-325 MG per tablet Take 1-2 tablets by mouth every 4 (four) hours as needed.  50 tablet  0  . sucralfate (CARAFATE) 1 G tablet Take 1 g by mouth 4 (four) times daily.      Marland Kitchen.  sulfamethoxazole-trimethoprim (BACTRIM DS) 800-160 MG per tablet Take 1 tablet by mouth 2 (two) times daily.      Marland Kitchen. zolpidem (AMBIEN CR) 12.5 MG CR tablet Take 1 tablet (12.5 mg total) by mouth at bedtime as needed for sleep.  30 tablet  0   No current facility-administered medications for this visit.    Previous Psychotropic Medications:  Medication Dose   Lexapro   20 mg daily                      Substance Abuse History in the last 12 months: Substance Age of 1st Use Last Use Amount Specific Type  Nicotine      Alcohol      Cannabis      Opiates      Cocaine      Methamphetamines      LSD      Ecstasy  Benzodiazepines      Caffeine      Inhalants      Others:                          Medical Consequences of Substance Abuse: n/a  Legal Consequences of Substance Abuse: n/a  Family Consequences of Substance Abuse: n/a  Blackouts:  No DT's:  No Withdrawal Symptoms:  No None  Social History: [place] [address]  [place] Birth[place] Family Members: Husband, 3 daughter Marital Status:  Married Children:   Sons:   Daughters: 3 Relationships:  Education:  [employer] Educational Problems/Performance:  Religious Beliefs/Practices: Christian History of Abuse: none Armed forces technical officerccupational Experiences; 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 Legal History: none Hobbies/Interests: Travel, scuba diving  Family History:   Family History  Problem Relation Age of Onset  . CAD Father     Premature  . Anxiety disorder Father   . CAD Mother     Premature  . Depression Other   . Depression Sister     Mental Status Examination/Evaluation: Objective:  Appearance: Casual and Well Groomed  Eye Contact::  Good  Speech:  Clear and Coherent  Volume:  Decreased  Mood:  Anxious depressed and tearful   Affect:  Constricted  Thought Process:  Goal Directed  Orientation:  Full (Time, Place, and  Person)  Thought Content:  WDL  Suicidal Thoughts:  No  Homicidal Thoughts:  No  Judgement:  Good  Insight:  Good  Psychomotor Activity:  Normal  Akathisia:  No  Handed:  Right  AIMS (if indicated):    Assets:  Communication Skills Desire for Improvement Talents/Skills    Laboratory/X-Ray Psychological Evaluation(s)        Assessment:  Axis I: Generalized Anxiety Disorder and Mood Disorder NOS  AXIS I Generalized Anxiety Disorder and Mood Disorder NOS  AXIS II Deferred  AXIS III Past Medical History  Diagnosis Date  . Essential hypertension, benign   . Anxiety   . Depression   . GERD (gastroesophageal reflux disease)   . PONV (postoperative nausea and vomiting)      AXIS IV problems with primary support group  AXIS V 61-70 mild symptoms   Treatment Plan/Recommendations:  Plan of Care: Medication management   Laboratory:    Psychotherapy: She is seeing Florencia ReasonsPeggy Bynum here   Medications: She will start Ambien XL 12.5 mg each bedtime to help with insomnia. When she returns from her trip she will start Prozac 20 mg every morning and taper off the Lexapro after about 4 days   Routine PRN Medications:  No  Consultations:   Safety Concerns:    Other: She will return in four-weeks     Diannia RuderOSS, Tremayne Sheldon, MD 4/30/20153:32 PM

## 2013-05-26 ENCOUNTER — Ambulatory Visit (HOSPITAL_COMMUNITY): Payer: Self-pay | Admitting: Psychiatry

## 2013-05-28 ENCOUNTER — Ambulatory Visit (INDEPENDENT_AMBULATORY_CARE_PROVIDER_SITE_OTHER): Payer: BC Managed Care – PPO | Admitting: Psychiatry

## 2013-05-28 ENCOUNTER — Encounter (HOSPITAL_COMMUNITY): Payer: Self-pay | Admitting: Psychiatry

## 2013-05-28 VITALS — BP 100/72 | Ht 61.0 in | Wt 162.0 lb

## 2013-05-28 DIAGNOSIS — F411 Generalized anxiety disorder: Secondary | ICD-10-CM

## 2013-05-28 DIAGNOSIS — F329 Major depressive disorder, single episode, unspecified: Secondary | ICD-10-CM

## 2013-05-28 DIAGNOSIS — F39 Unspecified mood [affective] disorder: Secondary | ICD-10-CM

## 2013-05-28 DIAGNOSIS — F32A Depression, unspecified: Secondary | ICD-10-CM

## 2013-05-28 MED ORDER — BUPROPION HCL ER (XL) 150 MG PO TB24: 150.0000 mg | ORAL_TABLET | ORAL | Status: DC

## 2013-05-28 MED ORDER — CLONAZEPAM 0.5 MG PO TABS: 0.5000 mg | ORAL_TABLET | Freq: Two times a day (BID) | ORAL | Status: DC

## 2013-05-28 MED ORDER — ESCITALOPRAM OXALATE 20 MG PO TABS: 20.0000 mg | ORAL_TABLET | Freq: Every day | ORAL | Status: DC

## 2013-05-28 MED ORDER — ZOLPIDEM TARTRATE ER 12.5 MG PO TBCR: 12.5000 mg | EXTENDED_RELEASE_TABLET | Freq: Every evening | ORAL | Status: DC | PRN

## 2013-05-28 NOTE — Progress Notes (Signed)
Patient ID: Makayla SloughMelissa F Beam, female   DOB: 04-09-1975, 38 y.o.   MRN: [id number] Patient ID: Makayla SloughMelissa F Wilson, female   DOB: 04-09-1975, 38 y.o.   MRN: [id number]  Psychiatric Assessment Adult  Patient Identification:  Makayla SloughMelissa F Awan Date of Evaluation:  05/28/2013 Chief Complaint: I'm doing a little better " History of Chief Complaint:   Chief Complaint  Patient presents with  . Anxiety  . Depression  . Follow-up    Anxiety Symptoms include nervous/anxious behavior.     this patient is a 38 year old married white female who lives with her husband and 3 girls ages 8518,16 and 7210 in [place]. 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  She states that she's always been a worrying type person. She is the youngest of 3 children and she is always been the most responsible child in the family. Her father took care of everyone in the family but he died 10 years ago and she had to do CPR to try to revive him. She still somewhat blames herself for his death.  The patient states that her mother brother and sister all live very close to her. They depend on her great deal as does her grandmother. Her mother has several health problems and she won't get medical care. The patient worries a good deal about her kids as well. Her 38 year old has always excelled at cheerleading and is currently at [employer]. She plans to transfer  to [employer] on a cheerleading scholarship. However she told the patient this past weekend that she doesn't want to do this anymore. Her middle daughter is a Advertising account plannerballet dancer and is very serious about it. She recently told her mother that she might be gay.  The patient has been on Lexapro for several years but recently lost the bottle. She states this might of contributed to the "meltdown" she had a few days ago after she found out about the news from both daughters. She was crying and somewhat hysterical which is  very unlike her. She's always very controlled and contained. Is very hard for her to say no to anyone in the family and she puts herself last. She's been crying a lot more, nervous and anxious having occasional panic attacks. She denies suicidal ideation. She does not have any psychotic symptoms and does not use drugs or alcohol. She always sleeps 3 or 4 hours per night  The patient returns after four-week's. She returned from her vacation on May 8 and felt great for a while. She tried the Prozac but decreased her libido and she's back on the Lexapro. She asked if we could add something to help with energy and I suggested Wellbutrin. 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 The Ambien is helping her sleep. She strained her right arm playing golf and this is very irritating to her but is starting to get better now on a prednisone Dosepak. She denies suicidal ideation but does have increased anxiety and I suggested she up the clonazepam to twice a day   Review of Systems  Constitutional: Negative.   HENT: Negative.   Eyes: Negative.   Respiratory: Negative.   Cardiovascular: Negative.   Gastrointestinal: Negative.   Endocrine: Negative.   Genitourinary: Negative.   Musculoskeletal: Negative.   Allergic/Immunologic: Negative.   Neurological: Negative.   Hematological: Negative.  Psychiatric/Behavioral: Positive for sleep disturbance and dysphoric mood. The patient is nervous/anxious.    Physical Exam not done  Depressive Symptoms: depressed mood, anhedonia, feelings of worthlessness/guilt, anxiety, panic attacks, insomnia,  (Hypo) Manic Symptoms:   Elevated Mood:  No Irritable Mood:  Yes Grandiosity:  No Distractibility:  No Labiality of Mood:  No Delusions:  No Hallucinations:  No Impulsivity:  No Sexually Inappropriate Behavior:  No Financial Extravagance:  No Flight of Ideas:  No  Anxiety  Symptoms: Excessive Worry:  Yes Panic Symptoms:  Yes Agoraphobia:  No Obsessive Compulsive: No  Symptoms: None, Specific Phobias:  No Social Anxiety:  No  Psychotic Symptoms:  Hallucinations: No None Delusions:  No Paranoia:  No   Ideas of Reference:  No  PTSD Symptoms: Ever had a traumatic exposure:  No Had a traumatic exposure in the last month:  No Re-experiencing: No None Hypervigilance:  No Hyperarousal: No None Avoidance: No None  Traumatic Brain Injury: No   Past Psychiatric History: Diagnosis: Depression   Hospitalizations: None   Outpatient Care: Only through primary care providers   Substance Abuse Care: none  Self-Mutilation: none  Suicidal Attempts: none  Violent Behaviors: none   Past Medical History:   Past Medical History  Diagnosis Date  . Essential hypertension, benign   . Anxiety   . Depression   . GERD (gastroesophageal reflux disease)   . PONV (postoperative nausea and vomiting)    History of Loss of Consciousness:  No Seizure History:  No Cardiac History:  No Allergies:   Allergies  Allergen Reactions  . Morphine And Related Hives  . Other     Glue on Johnson[employer]Johnson bandaids. Caused severe redness and reaction   Current Medications:  Current Outpatient Prescriptions  Medication Sig Dispense Refill  . buPROPion (WELLBUTRIN XL) 150 MG 24 hr tablet Take 1 tablet (150 mg total) by mouth every morning.  30 tablet  2  . clonazePAM (KLONOPIN) 0.5 MG tablet Take 1 tablet (0.5 mg total) by mouth 2 (two) times daily.  60 tablet  2  . escitalopram (LEXAPRO) 20 MG tablet Take 1 tablet (20 mg total) by mouth daily.  30 tablet  2  . ibuprofen (ADVIL,MOTRIN) 400 MG tablet Take 400 mg by mouth every 6 (six) hours as needed for moderate pain.      Marland Kitchen olmesartan-hydrochlorothiazide (BENICAR HCT) 20-12.5 MG per tablet Take 1 tablet by mouth daily.      . Omega-3 Fatty Acids (FISH OIL) 1000 MG CAPS Take 1,000 mg by mouth daily.      Marland Kitchen omeprazole  (PRILOSEC) 20 MG capsule Take 1 capsule (20 mg total) by mouth daily.  30 capsule  0  . OVER THE COUNTER MEDICATION Take 1 capsule by mouth daily. OTC biocell      . oxyCODONE-acetaminophen (PERCOCET) 7.5-325 MG per tablet Take 1-2 tablets by mouth every 4 (four) hours as needed.  50 tablet  0  . sucralfate (CARAFATE) 1 G tablet Take 1 g by mouth 4 (four) times daily.      Marland Kitchen sulfamethoxazole-trimethoprim (BACTRIM DS) 800-160 MG per tablet Take 1 tablet by mouth 2 (two) times daily.      Marland Kitchen zolpidem (AMBIEN CR) 12.5 MG CR tablet Take 1 tablet (12.5 mg total) by mouth at bedtime as needed for sleep.  30 tablet  2   No current facility-administered medications for this visit.    Previous Psychotropic Medications:  Medication Dose   Lexapro   20 mg daily  Substance Abuse History in the last 12 months: Substance Age of 1st Use Last Use Amount Specific Type  Nicotine      Alcohol      Cannabis      Opiates      Cocaine      Methamphetamines      LSD      Ecstasy      Benzodiazepines      Caffeine      Inhalants      Others:                          Medical Consequences of Substance Abuse: n/a  Legal Consequences of Substance Abuse: n/a  Family Consequences of Substance Abuse: n/a  Blackouts:  No DT's:  No Withdrawal Symptoms:  No None  Social History: Current Place of Residence: [place] [address] of Birth: [place] Family Members: Husband, 3 daughter Marital Status:  Married Children:   Sons:   Daughters: 3 Relationships:  Education:  Corporate treasurer Problems/Performance:  Religious Beliefs/Practices: Christian History of Abuse: none Armed forces technical officer; 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 Legal History: none Hobbies/Interests: Travel, scuba diving  Family History:   Family History  Problem Relation Age of Onset  . CAD Father     Premature  . Anxiety disorder  Father   . CAD Mother     Premature  . Depression Other   . Depression Sister     Mental Status Examination/Evaluation: Objective:  Appearance: Casual and Well Groomed  Eye Contact::  Good  Speech:  Clear and Coherent  Volume:  Decreased  Mood: Slightly anxious but depression is better   Affect:  Brighter   Thought Process:  Goal Directed  Orientation:  Full (Time, Place, and Person)  Thought Content:  WDL  Suicidal Thoughts:  No  Homicidal Thoughts:  No  Judgement:  Good  Insight:  Good  Psychomotor Activity:  Normal  Akathisia:  No  Handed:  Right  AIMS (if indicated):    Assets:  Communication Skills Desire for Improvement Talents/Skills    Laboratory/X-Ray Psychological Evaluation(s)        Assessment:  Axis I: Generalized Anxiety Disorder and Mood Disorder NOS  AXIS I Generalized Anxiety Disorder and Mood Disorder NOS  AXIS II Deferred  AXIS III Past Medical History  Diagnosis Date  . Essential hypertension, benign   . Anxiety   . Depression   . GERD (gastroesophageal reflux disease)   . PONV (postoperative nausea and vomiting)      AXIS IV problems with primary support group  AXIS V 61-70 mild symptoms   Treatment Plan/Recommendations:  Plan of Care: Medication management   Laboratory:    Psychotherapy: She is seeing Florencia Reasons here   Medications: She will continue Lexapro 20 mg every morning, Ambien XR 12.5 mg each bedtime, clonazepam 0.5 mg twice a day. We will add Wellbutrin XL 150 mg every morning   Routine PRN Medications:  No  Consultations:   Safety Concerns:    Other: She will return in four-weeks     Diannia Ruder, MD 5/28/20159:29 AM

## 2013-06-10 ENCOUNTER — Ambulatory Visit (HOSPITAL_COMMUNITY): Payer: Self-pay | Admitting: Psychiatry

## 2013-06-25 ENCOUNTER — Ambulatory Visit (INDEPENDENT_AMBULATORY_CARE_PROVIDER_SITE_OTHER): Payer: BC Managed Care – PPO | Admitting: Psychiatry

## 2013-06-25 ENCOUNTER — Encounter (HOSPITAL_COMMUNITY): Payer: Self-pay | Admitting: Psychiatry

## 2013-06-25 VITALS — BP 120/82 | Ht 61.0 in | Wt 163.0 lb

## 2013-06-25 DIAGNOSIS — F411 Generalized anxiety disorder: Secondary | ICD-10-CM

## 2013-06-25 DIAGNOSIS — F32A Depression, unspecified: Secondary | ICD-10-CM

## 2013-06-25 DIAGNOSIS — F39 Unspecified mood [affective] disorder: Secondary | ICD-10-CM

## 2013-06-25 DIAGNOSIS — F329 Major depressive disorder, single episode, unspecified: Secondary | ICD-10-CM

## 2013-06-25 MED ORDER — ESCITALOPRAM OXALATE 20 MG PO TABS: 20.0000 mg | ORAL_TABLET | Freq: Every day | ORAL | Status: DC

## 2013-06-25 MED ORDER — BUPROPION HCL ER (XL) 150 MG PO TB24: 150.0000 mg | ORAL_TABLET | ORAL | Status: DC

## 2013-06-25 NOTE — Progress Notes (Signed)
Patient ID: Makayla Wilson, female   DOB: 20-Oct-1975, 38 y.o.   MRN: [id number] Patient ID: Makayla Wilson, female   DOB: 20-Oct-1975, 38 y.o.   MRN: [id number] Patient ID: Makayla Wilson, female   DOB: 20-Oct-1975, 38 y.o.   MRN: [id number]  Psychiatric Assessment Adult  Patient Identification:  Makayla Wilson Date of Evaluation:  06/25/2013 Chief Complaint: I'm doing a little better " History of Chief Complaint:   Chief Complaint  Patient presents with  . Anxiety  . Depression  . Follow-up    Anxiety Symptoms include nervous/anxious behavior.     this patient is a 38 year old married white female who lives with her husband and 3 girls ages 2018,16 and 3210 in [place]. 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  She states that she's always been a worrying type person. She is the youngest of 3 children and she is always been the most responsible child in the family. Her father took care of everyone in the family but he died 10 years ago and she had to do CPR to try to revive him. She still somewhat blames herself for his death.  The patient states that her mother brother and sister all live very close to her. They depend on her great deal as does her grandmother. Her mother has several health problems and she won't get medical care. The patient worries a good deal about her kids as well. Her 38 year old has always excelled at cheerleading and is currently at [employer]. She plans to transfer  to [employer] on a cheerleading scholarship. However she told the patient this past weekend that she doesn't want to do this anymore. Her middle daughter is a Advertising account plannerballet dancer and is very serious about it. She recently told her mother that she might be gay.  The patient has been on Lexapro for several years but recently lost the bottle. She states this might of contributed to the "meltdown" she had a few days ago after she found out  about the news from both daughters. She was crying and somewhat hysterical which is very unlike her. She's always very controlled and contained. Is very hard for her to say no to anyone in the family and she puts herself last. She's been crying a lot more, nervous and anxious having occasional panic attacks. She denies suicidal ideation. She does not have any psychotic symptoms and does not use drugs or alcohol. She always sleeps 3 or 4 hours per night  The patient returns after four-week's.she is now on Wellbutrin and it seems to be helping quite a bit. Her mood is improved as has her energy. She is setting good boundaries with her family particularly her mother. She and her husband are making plans to eventually move to [place]. She denies any suicidal ideation.   Review of Systems  Constitutional: Negative.   HENT: Negative.   Eyes: Negative.   Respiratory: Negative.   Cardiovascular: Negative.   Gastrointestinal: Negative.   Endocrine: Negative.   Genitourinary: Negative.   Musculoskeletal: Negative.   Allergic/Immunologic: Negative.   Neurological: Negative.   Hematological: Negative.   Psychiatric/Behavioral: Positive for sleep disturbance and dysphoric mood. The patient is nervous/anxious.    Physical Exam not done  Depressive Symptoms: depressed mood, anhedonia, feelings of worthlessness/guilt, anxiety, panic attacks, insomnia,  (Hypo) Manic Symptoms:   Elevated Mood:  No Irritable Mood:  Yes Grandiosity:  No Distractibility:  No  Labiality of Mood:  No Delusions:  No Hallucinations:  No Impulsivity:  No Sexually Inappropriate Behavior:  No Financial Extravagance:  No Flight of Ideas:  No  Anxiety Symptoms: Excessive Worry:  Yes Panic Symptoms:  Yes Agoraphobia:  No Obsessive Compulsive: No  Symptoms: None, Specific Phobias:  No Social Anxiety:  No  Psychotic Symptoms:  Hallucinations: No None Delusions:  No Paranoia:  No   Ideas of Reference:  No  PTSD  Symptoms: Ever had a traumatic exposure:  No Had a traumatic exposure in the last month:  No Re-experiencing: No None Hypervigilance:  No Hyperarousal: No None Avoidance: No None  Traumatic Brain Injury: No   Past Psychiatric History: Diagnosis: Depression   Hospitalizations: None   Outpatient Care: Only through primary care providers   Substance Abuse Care: none  Self-Mutilation: none  Suicidal Attempts: none  Violent Behaviors: none   Past Medical History:   Past Medical History  Diagnosis Date  . Essential hypertension, benign   . Anxiety   . Depression   . GERD (gastroesophageal reflux disease)   . PONV (postoperative nausea and vomiting)    History of Loss of Consciousness:  No Seizure History:  No Cardiac History:  No Allergies:   Allergies  Allergen Reactions  . Morphine And Related Hives  . Other     Glue on Johnson&Johnson bandaids. Caused severe redness and reaction   Current Medications:  Current Outpatient Prescriptions  Medication Sig Dispense Refill  . buPROPion (WELLBUTRIN XL) 150 MG 24 hr tablet Take 1 tablet (150 mg total) by mouth every morning.  30 tablet  2  . clonazePAM (KLONOPIN) 0.5 MG tablet Take 1 tablet (0.5 mg total) by mouth 2 (two) times daily.  60 tablet  2  . escitalopram (LEXAPRO) 20 MG tablet Take 1 tablet (20 mg total) by mouth daily.  30 tablet  2  . ibuprofen (ADVIL,MOTRIN) 400 MG tablet Take 400 mg by mouth every 6 (six) hours as needed for moderate pain.      Marland Kitchen. olmesartan-hydrochlorothiazide (BENICAR HCT) 20-12.5 MG per tablet Take 1 tablet by mouth daily.      . Omega-3 Fatty Acids (FISH OIL) 1000 MG CAPS Take 1,000 mg by mouth daily.      Marland Kitchen. omeprazole (PRILOSEC) 20 MG capsule Take 1 capsule (20 mg total) by mouth daily.  30 capsule  0  . OVER THE COUNTER MEDICATION Take 1 capsule by mouth daily. OTC biocell      . oxyCODONE-acetaminophen (PERCOCET) 7.5-325 MG per tablet Take 1-2 tablets by mouth every 4 (four) hours as needed.   50 tablet  0  . sucralfate (CARAFATE) 1 G tablet Take 1 g by mouth 4 (four) times daily.      Marland Kitchen. sulfamethoxazole-trimethoprim (BACTRIM DS) 800-160 MG per tablet Take 1 tablet by mouth 2 (two) times daily.      Marland Kitchen. zolpidem (AMBIEN CR) 12.5 MG CR tablet Take 1 tablet (12.5 mg total) by mouth at bedtime as needed for sleep.  30 tablet  2   No current facility-administered medications for this visit.    Previous Psychotropic Medications:  Medication Dose   Lexapro   20 mg daily                      Substance Abuse History in the last 12 months: Substance Age of 1st Use Last Use Amount Specific Type  Nicotine      Alcohol      Cannabis  Opiates      Cocaine      Methamphetamines      LSD      Ecstasy      Benzodiazepines      Caffeine      Inhalants      Others:                          Medical Consequences of Substance Abuse: n/a  Legal Consequences of Substance Abuse: n/a  Family Consequences of Substance Abuse: n/a  Blackouts:  No DT's:  No Withdrawal Symptoms:  No None  Social History: Current Place of Residence: [place] [address] of Birth: [place] Family Members: Husband, 3 daughter Marital Status:  Married Children:   Sons:   Daughters: 3 Relationships:  Education:  Corporate treasurer Problems/Performance:  Religious Beliefs/Practices: Christian History of Abuse: none Armed forces technical officer; 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 Legal History: none Hobbies/Interests: Travel, scuba diving  Family History:   Family History  Problem Relation Age of Onset  . CAD Father     Premature  . Anxiety disorder Father   . CAD Mother     Premature  . Depression Other   . Depression Sister     Mental Status Examination/Evaluation: Objective:  Appearance: Casual and Well Groomed  Eye Contact::  Good  Speech:  Clear and Coherent  Volume:  Decreased  Mood: Good   Affect:  Bright   Thought Process:  Goal Directed  Orientation:  Full (Time, Place, and Person)  Thought Content:  WDL  Suicidal Thoughts:  No  Homicidal Thoughts:  No  Judgement:  Good  Insight:  Good  Psychomotor Activity:  Normal  Akathisia:  No  Handed:  Right  AIMS (if indicated):    Assets:  Communication Skills Desire for Improvement Talents/Skills    Laboratory/X-Ray Psychological Evaluation(s)        Assessment:  Axis I: Generalized Anxiety Disorder and Mood Disorder NOS  AXIS I Generalized Anxiety Disorder and Mood Disorder NOS  AXIS II Deferred  AXIS III Past Medical History  Diagnosis Date  . Essential hypertension, benign   . Anxiety   . Depression   . GERD (gastroesophageal reflux disease)   . PONV (postoperative nausea and vomiting)      AXIS IV problems with primary support group  AXIS V 61-70 mild symptoms   Treatment Plan/Recommendations:  Plan of Care: Medication management   Laboratory:    Psychotherapy: She is seeing Florencia Reasons here   Medications: She will continue Lexapro 20 mg every morning, Ambien XR 12.5 mg each bedtime, clonazepam 0.5 mg twice a day and Wellbutrin XL 150 mg every morning   Routine PRN Medications:  No  Consultations:   Safety Concerns:    Other: She will return in 2 months     Diannia Ruder, MD 6/25/20159:00 AM

## 2013-07-09 ENCOUNTER — Other Ambulatory Visit (HOSPITAL_COMMUNITY): Payer: Self-pay | Admitting: Orthopaedic Surgery

## 2013-07-09 DIAGNOSIS — M25511 Pain in right shoulder: Secondary | ICD-10-CM

## 2013-07-10 ENCOUNTER — Encounter (HOSPITAL_COMMUNITY): Payer: Self-pay | Admitting: Psychiatry

## 2013-07-10 ENCOUNTER — Ambulatory Visit (HOSPITAL_COMMUNITY): Payer: Self-pay | Admitting: Psychiatry

## 2013-07-13 ENCOUNTER — Ambulatory Visit (HOSPITAL_COMMUNITY)
Admission: RE | Admit: 2013-07-13 | Discharge: 2013-07-13 | Disposition: A | Discharge status: Home / Self Care | Payer: BC Managed Care – PPO | Source: Ambulatory Visit | Attending: Orthopaedic Surgery | Admitting: Orthopaedic Surgery

## 2013-07-13 DIAGNOSIS — M25511 Pain in right shoulder: Secondary | ICD-10-CM

## 2013-07-13 DIAGNOSIS — M25519 Pain in unspecified shoulder: Secondary | ICD-10-CM | POA: Insufficient documentation

## 2013-07-28 ENCOUNTER — Ambulatory Visit (HOSPITAL_COMMUNITY): Payer: Self-pay | Admitting: Psychiatry

## 2013-07-28 ENCOUNTER — Encounter: Payer: Self-pay | Admitting: Orthopedic Surgery

## 2013-07-28 ENCOUNTER — Ambulatory Visit: Payer: BC Managed Care – PPO | Admitting: Orthopedic Surgery

## 2013-08-04 ENCOUNTER — Ambulatory Visit (HOSPITAL_COMMUNITY): Payer: Self-pay | Admitting: Psychiatry

## 2013-08-25 ENCOUNTER — Ambulatory Visit (HOSPITAL_COMMUNITY): Payer: Self-pay | Admitting: Psychiatry

## 2013-08-25 ENCOUNTER — Encounter (HOSPITAL_COMMUNITY): Payer: Self-pay | Admitting: Psychiatry

## 2013-08-28 ENCOUNTER — Encounter (HOSPITAL_COMMUNITY): Payer: Self-pay | Admitting: Psychiatry

## 2013-08-28 ENCOUNTER — Ambulatory Visit (INDEPENDENT_AMBULATORY_CARE_PROVIDER_SITE_OTHER): Payer: BC Managed Care – PPO | Admitting: Psychiatry

## 2013-08-28 VITALS — BP 130/79 | HR 81 | Ht 61.0 in | Wt 160.8 lb

## 2013-08-28 DIAGNOSIS — F329 Major depressive disorder, single episode, unspecified: Secondary | ICD-10-CM

## 2013-08-28 DIAGNOSIS — F39 Unspecified mood [affective] disorder: Secondary | ICD-10-CM

## 2013-08-28 DIAGNOSIS — F411 Generalized anxiety disorder: Secondary | ICD-10-CM

## 2013-08-28 DIAGNOSIS — F32A Depression, unspecified: Secondary | ICD-10-CM

## 2013-08-28 MED ORDER — ESCITALOPRAM OXALATE 20 MG PO TABS: 20.0000 mg | ORAL_TABLET | Freq: Every day | ORAL | Status: DC

## 2013-08-28 MED ORDER — BUPROPION HCL ER (XL) 150 MG PO TB24: 150.0000 mg | ORAL_TABLET | ORAL | Status: DC

## 2013-08-28 MED ORDER — CLONAZEPAM 0.5 MG PO TABS: 0.5000 mg | ORAL_TABLET | Freq: Two times a day (BID) | ORAL | Status: DC

## 2013-08-28 MED ORDER — ZOLPIDEM TARTRATE 10 MG PO TABS: 10.0000 mg | ORAL_TABLET | Freq: Every evening | ORAL | Status: DC | PRN

## 2013-08-28 NOTE — Progress Notes (Signed)
Patient ID: KATRECE ROEDIGER, female   DOB: [birth year], 38 y.o.   MRN: [id number] Patient ID: KAYCE CHISMAR, female   DOB: 1975/10/30, 37 y.o.   MRN: [id number] Patient ID: VARETTA CHAVERS, female   DOB: 1975/05/13, 38 y.o.   MRN: [id number] Patient ID: BRENNLEY CURTICE, female   DOB: [birth year], 38 y.o.   MRN: [id number]  Psychiatric Assessment Adult  Patient Identification:  WANITA DERENZO Date of Evaluation:  08/28/2013 Chief Complaint: I'm doing a little better " History of Chief Complaint:   Chief Complaint  Patient presents with  . Anxiety  . Depression  . Follow-up    Anxiety Symptoms include nervous/anxious behavior.     this patient is a 38 year old married white female who lives with her husband and 3 girls ages 62,16 and 33 in 67. 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  She states that she's always been a worrying type person. She is the youngest of 3 children and she is always been the most responsible child in the family. Her father took care of everyone in the family but he died 10 years ago and she had to do CPR to try to revive him. She still somewhat blames herself for his death.  The patient states that her mother brother and sister all live very close to her. They depend on her great deal as does her grandmother. Her mother has several health problems and she won't get medical care. The patient worries a good deal about her kids as well. Her 72 year old has always excelled at cheerleading and is currently at [employer]. She plans to transfer  to [employer] on a cheerleading scholarship. However she told the patient this past weekend that she doesn't want to do this anymore. Her middle daughter is a Advertising account planner and is very serious about it. She recently told her mother that she might be gay.  The patient has been on Lexapro for several years but recently lost the bottle. She states this  might of contributed to the "meltdown" she had a few days ago after she found out about the news from both daughters. She was crying and somewhat hysterical which is very unlike her. She's always very controlled and contained. Is very hard for her to say no to anyone in the family and she puts herself last. She's been crying a lot more, nervous and anxious having occasional panic attacks. She denies suicidal ideation. She does not have any psychotic symptoms and does not use drugs or alcohol. She always sleeps 3 or 4 hours per night  The patient returns after 2 months. She's doing pretty well. She did fall on her right arm and bruised it badly and she already had shoulder problems. Her arm is now in a sling and her orthopedist is taken her out of work for 3 weeks. Her mood is generally been good and her anxiety is well controlled. Her children are doing better. She is learning to be detached from her mom and not get so involved with her mother's issues. She asked to try regular Ambien because the CR form was making her very groggy.   Review of Systems  Constitutional: Negative.   HENT: Negative.   Eyes: Negative.   Respiratory: Negative.   Cardiovascular: Negative.   Gastrointestinal: Negative.   Endocrine: Negative.   Genitourinary: Negative.   Musculoskeletal: Negative.   Allergic/Immunologic: Negative.   Neurological: Negative.  Hematological: Negative.   Psychiatric/Behavioral: Positive for sleep disturbance and dysphoric mood. The patient is nervous/anxious.    Physical Exam not done  Depressive Symptoms: depressed mood, anhedonia, feelings of worthlessness/guilt, anxiety, panic attacks, insomnia,  (Hypo) Manic Symptoms:   Elevated Mood:  No Irritable Mood:  Yes Grandiosity:  No Distractibility:  No Labiality of Mood:  No Delusions:  No Hallucinations:  No Impulsivity:  No Sexually Inappropriate Behavior:  No Financial Extravagance:  No Flight of Ideas:  No  Anxiety  Symptoms: Excessive Worry:  Yes Panic Symptoms:  Yes Agoraphobia:  No Obsessive Compulsive: No  Symptoms: None, Specific Phobias:  No Social Anxiety:  No  Psychotic Symptoms:  Hallucinations: No None Delusions:  No Paranoia:  No   Ideas of Reference:  No  PTSD Symptoms: Ever had a traumatic exposure:  No Had a traumatic exposure in the last month:  No Re-experiencing: No None Hypervigilance:  No Hyperarousal: No None Avoidance: No None  Traumatic Brain Injury: No   Past Psychiatric History: Diagnosis: Depression   Hospitalizations: None   Outpatient Care: Only through primary care providers   Substance Abuse Care: none  Self-Mutilation: none  Suicidal Attempts: none  Violent Behaviors: none   Past Medical History:   Past Medical History  Diagnosis Date  . Essential hypertension, benign   . Anxiety   . Depression   . GERD (gastroesophageal reflux disease)   . PONV (postoperative nausea and vomiting)    History of Loss of Consciousness:  No Seizure History:  No Cardiac History:  No Allergies:   Allergies  Allergen Reactions  . Morphine And Related Hives  . Other     Glue on Johnson&Johnson bandaids. Caused severe redness and reaction   Current Medications:  Current Outpatient Prescriptions  Medication Sig Dispense Refill  . buPROPion (WELLBUTRIN XL) 150 MG 24 hr tablet Take 1 tablet (150 mg total) by mouth every morning.  30 tablet  2  . clonazePAM (KLONOPIN) 0.5 MG tablet Take 1 tablet (0.5 mg total) by mouth 2 (two) times daily.  60 tablet  2  . escitalopram (LEXAPRO) 20 MG tablet Take 1 tablet (20 mg total) by mouth daily.  30 tablet  2  . ibuprofen (ADVIL,MOTRIN) 400 MG tablet Take 400 mg by mouth every 6 (six) hours as needed for moderate pain.      Marland Kitchen olmesartan-hydrochlorothiazide (BENICAR HCT) 20-12.5 MG per tablet Take 1 tablet by mouth daily.      . Omega-3 Fatty Acids (FISH OIL) 1000 MG CAPS Take 1,000 mg by mouth daily.      Marland Kitchen omeprazole  (PRILOSEC) 20 MG capsule Take 1 capsule (20 mg total) by mouth daily.  30 capsule  0  . OVER THE COUNTER MEDICATION Take 1 capsule by mouth daily. OTC biocell      . zolpidem (AMBIEN) 10 MG tablet Take 1 tablet (10 mg total) by mouth at bedtime as needed for sleep.  30 tablet  2   No current facility-administered medications for this visit.    Previous Psychotropic Medications:  Medication Dose   Lexapro   20 mg daily                      Substance Abuse History in the last 12 months: Substance Age of 1st Use Last Use Amount Specific Type  Nicotine      Alcohol      Cannabis      Opiates      Cocaine  Methamphetamines      LSD      Ecstasy      Benzodiazepines      Caffeine      Inhalants      Others:                          Medical Consequences of Substance Abuse: n/a  Legal Consequences of Substance Abuse: n/a  Family Consequences of Substance Abuse: n/a  Blackouts:  No DT's:  No Withdrawal Symptoms:  No None  Social History: Current Place of Residence: [place] [address] of Birth: [place] Family Members: Husband, 3 daughter Marital Status:  Married Children:   Sons:   Daughters: 3 Relationships:  Education:  Corporate treasurer Problems/Performance:  Religious Beliefs/Practices: Christian History of Abuse: none Armed forces technical officer; 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 Legal History: none Hobbies/Interests: Travel, scuba diving  Family History:   Family History  Problem Relation Age of Onset  . CAD Father     Premature  . Anxiety disorder Father   . CAD Mother     Premature  . Depression Other   . Depression Sister     Mental Status Examination/Evaluation: Objective:  Appearance: Casual and Well Groomed  Eye Contact::  Good  Speech:  Clear and Coherent  Volume:  Decreased  Mood: Good   Affect:  Bright  Thought Process:  Goal Directed  Orientation:  Full (Time,  Place, and Person)  Thought Content:  WDL  Suicidal Thoughts:  No  Homicidal Thoughts:  No  Judgement:  Good  Insight:  Good  Psychomotor Activity:  Normal  Akathisia:  No  Handed:  Right  AIMS (if indicated):    Assets:  Communication Skills Desire for Improvement Talents/Skills    Laboratory/X-Ray Psychological Evaluation(s)        Assessment:  Axis I: Generalized Anxiety Disorder and Mood Disorder NOS  AXIS I Generalized Anxiety Disorder and Mood Disorder NOS  AXIS II Deferred  AXIS III Past Medical History  Diagnosis Date  . Essential hypertension, benign   . Anxiety   . Depression   . GERD (gastroesophageal reflux disease)   . PONV (postoperative nausea and vomiting)      AXIS IV problems with primary support group  AXIS V 61-70 mild symptoms   Treatment Plan/Recommendations:  Plan of Care: Medication management   Laboratory:    Psychotherapy: She is seeing Florencia Reasons here   Medications: She will continue Lexapro 20 mg every morning, clonazepam 0.5 mg twice a day and Wellbutrin XL 150 mg every morning.she'll discontinue Ambien CR and start Ambien 10 mg each bedtime   Routine PRN Medications:  No  Consultations:   Safety Concerns:    Other: She will return in 2 months     Diannia Ruder, MD 8/28/20154:11 PM

## 2013-10-28 ENCOUNTER — Ambulatory Visit (INDEPENDENT_AMBULATORY_CARE_PROVIDER_SITE_OTHER): Payer: BC Managed Care – PPO | Admitting: Psychiatry

## 2013-10-28 ENCOUNTER — Encounter (HOSPITAL_COMMUNITY): Payer: Self-pay | Admitting: Psychiatry

## 2013-10-28 VITALS — BP 123/85 | HR 105 | Ht 61.0 in | Wt 165.6 lb

## 2013-10-28 DIAGNOSIS — F411 Generalized anxiety disorder: Secondary | ICD-10-CM

## 2013-10-28 DIAGNOSIS — F329 Major depressive disorder, single episode, unspecified: Secondary | ICD-10-CM

## 2013-10-28 DIAGNOSIS — F39 Unspecified mood [affective] disorder: Secondary | ICD-10-CM

## 2013-10-28 DIAGNOSIS — F32A Depression, unspecified: Secondary | ICD-10-CM

## 2013-10-28 MED ORDER — BUPROPION HCL ER (XL) 150 MG PO TB24: 150.0000 mg | ORAL_TABLET | ORAL | Status: DC

## 2013-10-28 MED ORDER — ESCITALOPRAM OXALATE 20 MG PO TABS: 20.0000 mg | ORAL_TABLET | Freq: Every day | ORAL | Status: DC

## 2013-10-28 MED ORDER — CLONAZEPAM 0.5 MG PO TABS: 0.5000 mg | ORAL_TABLET | Freq: Two times a day (BID) | ORAL | Status: DC

## 2013-10-28 MED ORDER — ZOLPIDEM TARTRATE 10 MG PO TABS: 10.0000 mg | ORAL_TABLET | Freq: Every evening | ORAL | Status: DC | PRN

## 2013-10-28 NOTE — Progress Notes (Signed)
Patient ID: Makayla Wilson, female   DOB: 03-01-1975, 38 y.o.   MRN: [id number] Patient ID: Makayla Wilson, female   DOB: 1975-03-06, 38 y.o.   MRN: [id number] Patient ID: Makayla Wilson, female   DOB: 14-Nov-1975, 38 y.o.   MRN: [id number] Patient ID: Makayla Wilson, female   DOB: 30-Nov-1975, 38 y.o.   MRN: [id number] Patient ID: Makayla Wilson, female   DOB: [birth year], 38 y.o.   MRN: [id number]  Psychiatric Assessment Adult  Patient Identification:  Makayla Wilson Date of Evaluation:  10/28/2013 Chief Complaint: I'm doing a little better " History of Chief Complaint:   Chief Complaint  Patient presents with  . Anxiety  . Depression  . Follow-up    Anxiety Symptoms include nervous/anxious behavior.     this patient is a 38 year old married white female who lives with her husband and 3 girls ages 63,16 and 88 in 40. 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  She states that she's always been a worrying type person. She is the youngest of 3 children and she is always been the most responsible child in the family. Her father took care of everyone in the family but he died 10 years ago and she had to do CPR to try to revive him. She still somewhat blames herself for his death.  The patient states that her mother brother and sister all live very close to her. They depend on her great deal as does her grandmother. Her mother has several health problems and she won't get medical care. The patient worries a good deal about her kids as well. Her 86 year old has always excelled at cheerleading and is currently at [employer]. She plans to transfer  to [employer] on a cheerleading scholarship. However she told the patient this past weekend that she doesn't want to do this anymore. Her middle daughter is a Advertising account planner and is very serious about it. She recently told her mother that she might be gay.  The patient has  been on Lexapro for several years but recently lost the bottle. She states this might of contributed to the "meltdown" she had a few days ago after she found out about the news from both daughters. She was crying and somewhat hysterical which is very unlike her. She's always very controlled and contained. Is very hard for her to say no to anyone in the family and she puts herself last. She's been crying a lot more, nervous and anxious having occasional panic attacks. She denies suicidal ideation. She does not have any psychotic symptoms and does not use drugs or alcohol. She always sleeps 3 or 4 hours per night  The patient returns after 3 months. She had fallen the summer and hurt her right arm and it has taken a long time to heal. Her right hand in particular is very sore and it's hard for her to type so she has not been able to work. She will be going back to work next week. Her youngest daughter also broke her right arm and her middle daughter develop shingles so she has been extremely busy helping these children. Overall her mood has been good she is sleeping well and she's trying to keep a positive outlook   Review of Systems  Constitutional: Negative.   HENT: Negative.   Eyes: Negative.   Respiratory: Negative.   Cardiovascular: Negative.   Gastrointestinal: Negative.  Endocrine: Negative.   Genitourinary: Negative.   Musculoskeletal: Negative.   Allergic/Immunologic: Negative.   Neurological: Negative.   Hematological: Negative.   Psychiatric/Behavioral: Positive for sleep disturbance and dysphoric mood. The patient is nervous/anxious.    Physical Exam not done  Depressive Symptoms: depressed mood, anhedonia, feelings of worthlessness/guilt, anxiety, panic attacks, insomnia,  (Hypo) Manic Symptoms:   Elevated Mood:  No Irritable Mood:  Yes Grandiosity:  No Distractibility:  No Labiality of Mood:  No Delusions:  No Hallucinations:  No Impulsivity:  No Sexually  Inappropriate Behavior:  No Financial Extravagance:  No Flight of Ideas:  No  Anxiety Symptoms: Excessive Worry:  Yes Panic Symptoms:  Yes Agoraphobia:  No Obsessive Compulsive: No  Symptoms: None, Specific Phobias:  No Social Anxiety:  No  Psychotic Symptoms:  Hallucinations: No None Delusions:  No Paranoia:  No   Ideas of Reference:  No  PTSD Symptoms: Ever had a traumatic exposure:  No Had a traumatic exposure in the last month:  No Re-experiencing: No None Hypervigilance:  No Hyperarousal: No None Avoidance: No None  Traumatic Brain Injury: No   Past Psychiatric History: Diagnosis: Depression   Hospitalizations: None   Outpatient Care: Only through primary care providers   Substance Abuse Care: none  Self-Mutilation: none  Suicidal Attempts: none  Violent Behaviors: none   Past Medical History:   Past Medical History  Diagnosis Date  . Essential hypertension, benign   . Anxiety   . Depression   . GERD (gastroesophageal reflux disease)   . PONV (postoperative nausea and vomiting)    History of Loss of Consciousness:  No Seizure History:  No Cardiac History:  No Allergies:   Allergies  Allergen Reactions  . Morphine And Related Hives  . Other     Glue on Johnson&Johnson bandaids. Caused severe redness and reaction   Current Medications:  Current Outpatient Prescriptions  Medication Sig Dispense Refill  . buPROPion (WELLBUTRIN XL) 150 MG 24 hr tablet Take 1 tablet (150 mg total) by mouth every morning.  30 tablet  2  . clonazePAM (KLONOPIN) 0.5 MG tablet Take 1 tablet (0.5 mg total) by mouth 2 (two) times daily.  60 tablet  2  . escitalopram (LEXAPRO) 20 MG tablet Take 1 tablet (20 mg total) by mouth daily.  30 tablet  2  . ibuprofen (ADVIL,MOTRIN) 400 MG tablet Take 400 mg by mouth every 6 (six) hours as needed for moderate pain.      Marland Kitchen. olmesartan-hydrochlorothiazide (BENICAR HCT) 20-12.5 MG per tablet Take 1 tablet by mouth daily.      . Omega-3  Fatty Acids (FISH OIL) 1000 MG CAPS Take 1,000 mg by mouth daily.      Marland Kitchen. omeprazole (PRILOSEC) 20 MG capsule Take 1 capsule (20 mg total) by mouth daily.  30 capsule  0  . OVER THE COUNTER MEDICATION Take 1 capsule by mouth daily. OTC biocell      . zolpidem (AMBIEN) 10 MG tablet Take 1 tablet (10 mg total) by mouth at bedtime as needed for sleep.  30 tablet  2   No current facility-administered medications for this visit.    Previous Psychotropic Medications:  Medication Dose   Lexapro   20 mg daily                      Substance Abuse History in the last 12 months: Substance Age of 1st Use Last Use Amount Specific Type  Nicotine  Alcohol      Cannabis      Opiates      Cocaine      Methamphetamines      LSD      Ecstasy      Benzodiazepines      Caffeine      Inhalants      Others:                          Medical Consequences of Substance Abuse: n/a  Legal Consequences of Substance Abuse: n/a  Family Consequences of Substance Abuse: n/a  Blackouts:  No DT's:  No Withdrawal Symptoms:  No None  Social History: [place] Residence[place] [address] [place] Place [place] Birth[place] Family Members: Husband, 3 daughter Marital Status:  Married Children:   Sons:   Daughters: 3 Relationships:  Education:  [employer]ate [employer] Educational Problems/Performance:  Religious Beliefs/Practices: Christian History of Abuse: none Armed forces technical officerccupational Experiences; 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 Legal History: none Hobbies/Interests: Travel, scuba diving  Family History:   Family History  Problem Relation Age of Onset  . CAD Father     Premature  . Anxiety disorder Father   . CAD Mother     Premature  . Depression Other   . Depression Sister     Mental Status Examination/Evaluation: Objective:  Appearance: Casual and Well Groomed  Eye Contact::  Good  Speech:  Clear and Coherent  Volume:  Decreased  Mood:  Good   Affect:  Bright  Thought Process:  Goal Directed  Orientation:  Full (Time, Place, and Person)  Thought Content:  WDL  Suicidal Thoughts:  No  Homicidal Thoughts:  No  Judgement:  Good  Insight:  Good  Psychomotor Activity:  Normal  Akathisia:  No  Handed:  Right  AIMS (if indicated):    Assets:  Communication Skills Desire for Improvement Talents/Skills    Laboratory/X-Ray Psychological Evaluation(s)        Assessment:  Axis I: Generalized Anxiety Disorder and Mood Disorder NOS  AXIS I Generalized Anxiety Disorder and Mood Disorder NOS  AXIS II Deferred  AXIS III Past Medical History  Diagnosis Date  . Essential hypertension, benign   . Anxiety   . Depression   . GERD (gastroesophageal reflux disease)   . PONV (postoperative nausea and vomiting)      AXIS IV problems with primary support group  AXIS V 61-70 mild symptoms   Treatment Plan/Recommendations:  Plan of Care: Medication management   Laboratory:    Psychotherapy: She is seeing Florencia ReasonsPeggy Bynum here   Medications: She will continue Lexapro 20 mg every morning, clonazepam 0.5 mg twice a day and Wellbutrin XL 150 mg every morning and start Ambien 10 mg each bedtime   Routine PRN Medications:  No  Consultations:   Safety Concerns:    Other: She will return in 3 months     Amna Welker, Gavin PoundEBORAH, MD 10/28/20158:55 AM

## 2013-11-05 ENCOUNTER — Other Ambulatory Visit (HOSPITAL_COMMUNITY): Payer: Self-pay | Admitting: Orthopedic Surgery

## 2013-11-05 DIAGNOSIS — M24131 Other articular cartilage disorders, right wrist: Secondary | ICD-10-CM

## 2013-11-05 DIAGNOSIS — S62001S Unspecified fracture of navicular [scaphoid] bone of right wrist, sequela: Secondary | ICD-10-CM

## 2013-11-05 DIAGNOSIS — M19131 Post-traumatic osteoarthritis, right wrist: Secondary | ICD-10-CM

## 2013-11-11 ENCOUNTER — Ambulatory Visit (HOSPITAL_COMMUNITY)
Admission: RE | Admit: 2013-11-11 | Discharge: 2013-11-11 | Disposition: A | Discharge status: Home / Self Care | Payer: BC Managed Care – PPO | Source: Ambulatory Visit | Attending: Orthopedic Surgery | Admitting: Orthopedic Surgery

## 2013-11-11 ENCOUNTER — Encounter (HOSPITAL_COMMUNITY): Payer: Self-pay

## 2013-11-11 ENCOUNTER — Other Ambulatory Visit (HOSPITAL_COMMUNITY): Payer: Self-pay | Admitting: Orthopedic Surgery

## 2013-11-11 DIAGNOSIS — S62001S Unspecified fracture of navicular [scaphoid] bone of right wrist, sequela: Secondary | ICD-10-CM

## 2013-11-11 DIAGNOSIS — M24131 Other articular cartilage disorders, right wrist: Secondary | ICD-10-CM

## 2013-11-11 DIAGNOSIS — M25531 Pain in right wrist: Secondary | ICD-10-CM | POA: Diagnosis present

## 2013-11-11 DIAGNOSIS — M19131 Post-traumatic osteoarthritis, right wrist: Secondary | ICD-10-CM

## 2013-11-11 MED ORDER — LIDOCAINE HCL (PF) 1 % IJ SOLN
INTRAMUSCULAR | Status: AC
  Filled 2013-11-11: qty 5

## 2013-11-11 MED ORDER — LIDOCAINE HCL (PF) 1 % IJ SOLN
INTRAMUSCULAR | Status: AC
  Administered 2013-11-11: 5 mL
  Filled 2013-11-11: qty 5

## 2013-11-11 MED ORDER — GADOBENATE DIMEGLUMINE 529 MG/ML IV SOLN
5.0000 mL | Freq: Once | INTRAVENOUS | Status: AC | PRN
  Administered 2013-11-11: 0.05 mL via INTRAVENOUS

## 2013-11-11 MED ORDER — POVIDONE-IODINE 10 % EX SOLN
CUTANEOUS | Status: AC
  Administered 2013-11-11: 15:00:00
  Filled 2013-11-11: qty 15

## 2013-11-11 MED ORDER — IOHEXOL 300 MG/ML  SOLN
50.0000 mL | Freq: Once | INTRAMUSCULAR | Status: AC | PRN
  Administered 2013-11-11: 5 mL via INTRAVENOUS

## 2014-01-28 ENCOUNTER — Encounter (HOSPITAL_COMMUNITY): Payer: Self-pay | Admitting: Psychiatry

## 2014-01-28 ENCOUNTER — Ambulatory Visit (HOSPITAL_COMMUNITY): Payer: Self-pay | Admitting: Psychiatry

## 2014-02-04 ENCOUNTER — Encounter (HOSPITAL_COMMUNITY): Payer: Self-pay | Admitting: Psychiatry

## 2014-02-04 ENCOUNTER — Ambulatory Visit (HOSPITAL_COMMUNITY): Payer: Self-pay | Admitting: Psychiatry

## 2014-02-23 ENCOUNTER — Ambulatory Visit (INDEPENDENT_AMBULATORY_CARE_PROVIDER_SITE_OTHER): Payer: BLUE CROSS/BLUE SHIELD | Admitting: Psychiatry

## 2014-02-23 ENCOUNTER — Encounter (HOSPITAL_COMMUNITY): Payer: Self-pay | Admitting: Psychiatry

## 2014-02-23 VITALS — BP 113/85 | HR 100 | Ht 61.0 in | Wt 174.6 lb

## 2014-02-23 DIAGNOSIS — F329 Major depressive disorder, single episode, unspecified: Secondary | ICD-10-CM

## 2014-02-23 DIAGNOSIS — F39 Unspecified mood [affective] disorder: Secondary | ICD-10-CM

## 2014-02-23 DIAGNOSIS — F32A Depression, unspecified: Secondary | ICD-10-CM

## 2014-02-23 DIAGNOSIS — F411 Generalized anxiety disorder: Secondary | ICD-10-CM

## 2014-02-23 MED ORDER — BUPROPION HCL ER (XL) 150 MG PO TB24: 150.0000 mg | ORAL_TABLET | ORAL | Status: DC

## 2014-02-23 MED ORDER — ESCITALOPRAM OXALATE 20 MG PO TABS
20.0000 mg | ORAL_TABLET | Freq: Every day | ORAL | Status: DC
Start: 2014-02-23 — End: 2014-04-26

## 2014-02-23 MED ORDER — CLONAZEPAM 1 MG PO TABS: 1.0000 mg | ORAL_TABLET | Freq: Two times a day (BID) | ORAL | Status: DC

## 2014-02-23 MED ORDER — ZOLPIDEM TARTRATE 10 MG PO TABS: 10.0000 mg | ORAL_TABLET | Freq: Every evening | ORAL | Status: DC | PRN

## 2014-02-23 NOTE — Progress Notes (Signed)
Patient ID: Lindaann SloughMelissa F Lucatero, female   DOB: December 04, 1975, 39 y.o.   MRN: [id number] Patient ID: Lindaann SloughMelissa F Sporn, female   DOB: December 04, 1975, 39 y.o.   MRN: [id number] Patient ID: Lindaann SloughMelissa F Menz, female   DOB: December 04, 1975, 39 y.o.   MRN: [id number] Patient ID: Lindaann SloughMelissa F Crew, female   DOB: December 04, 1975, 39 y.o.   MRN: [id number] Patient ID: Lindaann SloughMelissa F Drumwright, female   DOB: December 04, 1975, 39 y.o.   MRN: [id number] Patient ID: Lindaann SloughMelissa F Gaetano, female   DOB: December 04, 1975, 39 y.o.   MRN: [id number]  Psychiatric Assessment Adult  Patient Identification:  Lindaann SloughMelissa F Postlethwait Date of Evaluation:  02/23/2014 Chief Complaint: "I've been stressed lately History of Chief Complaint:   Chief Complaint  Patient presents with  . Depression  . Anxiety  . Follow-up    Anxiety Symptoms include nervous/anxious behavior.     this patient is a 39 year old married white female who lives with her husband and 3 girls ages 3518,16 and 3510 in [place]. 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  She states that she's always been a worrying type person. She is the youngest of 3 children and she is always been the most responsible child in the family. Her father took care of everyone in the family but he died 10 years ago and she had to do CPR to try to revive him. She still somewhat blames herself for his death.  The patient states that her mother brother and sister all live very close to her. They depend on her great deal as does her grandmother. Her mother has several health problems and she won't get medical care. The patient worries a good deal about her kids as well. Her 10877 year old has always excelled at cheerleading and is currently at [employer]. She plans to transfer  to [place] on a cheerleading scholarship. However she told the patient this past weekend that she doesn't want to do this anymore. Her middle daughter is a Advertising account plannerballet dancer and is very serious  about it. She recently told her mother that she might be gay.  The patient has been on Lexapro for several years but recently lost the bottle. She states this might of contributed to the "meltdown" she had a few days ago after she found out about the news from both daughters. She was crying and somewhat hysterical which is very unlike her. She's always very controlled and contained. Is very hard for her to say no to anyone in the family and she puts herself last. She's been crying a lot more, nervous and anxious having occasional panic attacks. She denies suicidal ideation. She does not have any psychotic symptoms and does not use drugs or alcohol. She always sleeps 3 or 4 hours per night  The patient returns after 4 months. She has been more stressed lately. Her mother had a heart attack in December but she is doing better now. Her husband's brother and his 2 teenage children have moved in which is caused a lot of stress. Her job is become more difficult as well. She's trying to stay active and she is sleeping pretty well and planning [place] to [place]. She asked if we can increase the clonazepam a bit and I think this is reasonable. She denies being significantly depressed or suicidal   Review of Systems  Constitutional: Negative.   HENT: Negative.   Eyes: Negative.   Respiratory: Negative.  Cardiovascular: Negative.   Gastrointestinal: Negative.   Endocrine: Negative.   Genitourinary: Negative.   Musculoskeletal: Negative.   Allergic/Immunologic: Negative.   Neurological: Negative.   Hematological: Negative.   Psychiatric/Behavioral: Positive for sleep disturbance and dysphoric mood. The patient is nervous/anxious.    Physical Exam not done  Depressive Symptoms: depressed mood, anhedonia, feelings of worthlessness/guilt, anxiety, panic attacks, insomnia,  (Hypo) Manic Symptoms:   Elevated Mood:  No Irritable Mood:  Yes Grandiosity:  No Distractibility:  No Labiality of  Mood:  No Delusions:  No Hallucinations:  No Impulsivity:  No Sexually Inappropriate Behavior:  No Financial Extravagance:  No Flight of Ideas:  No  Anxiety Symptoms: Excessive Worry:  Yes Panic Symptoms:  Yes Agoraphobia:  No Obsessive Compulsive: No  Symptoms: None, Specific Phobias:  No Social Anxiety:  No  Psychotic Symptoms:  Hallucinations: No None Delusions:  No Paranoia:  No   Ideas of Reference:  No  PTSD Symptoms: Ever had a traumatic exposure:  No Had a traumatic exposure in the last month:  No Re-experiencing: No None Hypervigilance:  No Hyperarousal: No None Avoidance: No None  Traumatic Brain Injury: No   Past Psychiatric History: Diagnosis: Depression   Hospitalizations: None   Outpatient Care: Only through primary care providers   Substance Abuse Care: none  Self-Mutilation: none  Suicidal Attempts: none  Violent Behaviors: none   Past Medical History:   Past Medical History  Diagnosis Date  . Essential hypertension, benign   . Anxiety   . Depression   . GERD (gastroesophageal reflux disease)   . PONV (postoperative nausea and vomiting)    History of Loss of Consciousness:  No Seizure History:  No Cardiac History:  No Allergies:   Allergies  Allergen Reactions  . Morphine And Related Hives  . Neurontin [Gabapentin]   . Other     Glue on Johnson&Johnson bandaids. Caused severe redness and reaction   Current Medications:  Current Outpatient Prescriptions  Medication Sig Dispense Refill  . buPROPion (WELLBUTRIN XL) 150 MG 24 hr tablet Take 1 tablet (150 mg total) by mouth every morning. 30 tablet 2  . escitalopram (LEXAPRO) 20 MG tablet Take 1 tablet (20 mg total) by mouth daily. 30 tablet 2  . ibuprofen (ADVIL,MOTRIN) 400 MG tablet Take 400 mg by mouth every 6 (six) hours as needed for moderate pain.    Marland Kitchen olmesartan-hydrochlorothiazide (BENICAR HCT) 20-12.5 MG per tablet Take 1 tablet by mouth daily.    . Omega-3 Fatty Acids (FISH  OIL) 1000 MG CAPS Take 1,000 mg by mouth daily.    Marland Kitchen omeprazole (PRILOSEC) 20 MG capsule Take 1 capsule (20 mg total) by mouth daily. 30 capsule 0  . OVER THE COUNTER MEDICATION Take 1 capsule by mouth daily. OTC biocell    . zolpidem (AMBIEN) 10 MG tablet Take 1 tablet (10 mg total) by mouth at bedtime as needed for sleep. 30 tablet 2  . clonazePAM (KLONOPIN) 1 MG tablet Take 1 tablet (1 mg total) by mouth 2 (two) times daily. 60 tablet 2   No current facility-administered medications for this visit.    Previous Psychotropic Medications:  Medication Dose   Lexapro   20 mg daily                      Substance Abuse History in the last 12 months: Substance Age of 1st Use Last Use Amount Specific Type  Nicotine      Alcohol  Cannabis      Opiates      Cocaine      Methamphetamines      LSD      Ecstasy      Benzodiazepines      Caffeine      Inhalants      Others:                          Medical Consequences of Substance Abuse: n/a  Legal Consequences of Substance Abuse: n/a  Family Consequences of Substance Abuse: n/a  Blackouts:  No DT's:  No Withdrawal Symptoms:  No None  Social History: Current Place of Residence: [place] [address] of Birth: [place] Family Members: Husband, 3 daughter Marital Status:  Married Children:   Sons:   Daughters: 3 Relationships:  Education:  Corporate treasurer Problems/Performance:  Religious Beliefs/Practices: Christian History of Abuse: none Armed forces technical officer; 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 Legal History: none Hobbies/Interests: Travel, scuba diving  Family History:   Family History  Problem Relation Age of Onset  . CAD Father     Premature  . Anxiety disorder Father   . CAD Mother     Premature  . Depression Other   . Depression Sister     Mental Status Examination/Evaluation: Objective:  Appearance: Casual and Well Groomed   Eye Contact::  Good  Speech:  Clear and Coherent  Volume:  Decreased  Mood: Fairly good   Affect:  Anxious   Thought Process:  Goal Directed  Orientation:  Full (Time, Place, and Person)  Thought Content:  WDL  Suicidal Thoughts:  No  Homicidal Thoughts:  No  Judgement:  Good  Insight:  Good  Psychomotor Activity:  Normal  Akathisia:  No  Handed:  Right  AIMS (if indicated):    Assets:  Communication Skills Desire for Improvement Talents/Skills    Laboratory/X-Ray Psychological Evaluation(s)        Assessment:  Axis I: Generalized Anxiety Disorder and Mood Disorder NOS  AXIS I Generalized Anxiety Disorder and Mood Disorder NOS  AXIS II Deferred  AXIS III Past Medical History  Diagnosis Date  . Essential hypertension, benign   . Anxiety   . Depression   . GERD (gastroesophageal reflux disease)   . PONV (postoperative nausea and vomiting)      AXIS IV problems with primary support group  AXIS V 61-70 mild symptoms   Treatment Plan/Recommendations:  Plan of Care: Medication management   Laboratory:    Psychotherapy: She is seeing Florencia Reasons here   Medications: She will continue Lexapro 20 mg every morning,twice a day and Wellbutrin XL 150 mg every morning and Ambien 10 mg each bedtime . she'll increase clonazepam to 1 mg twice a day   Routine PRN Medications:  No  Consultations:   Safety Concerns:    Other: She will return in 2 months     Diannia Ruder, MD 2/23/201612:01 PM

## 2014-03-24 ENCOUNTER — Other Ambulatory Visit (HOSPITAL_COMMUNITY): Payer: Self-pay | Admitting: Orthopedic Surgery

## 2014-03-24 DIAGNOSIS — M24131 Other articular cartilage disorders, right wrist: Secondary | ICD-10-CM

## 2014-04-12 ENCOUNTER — Encounter (HOSPITAL_COMMUNITY): Payer: BLUE CROSS/BLUE SHIELD

## 2014-04-26 ENCOUNTER — Ambulatory Visit (INDEPENDENT_AMBULATORY_CARE_PROVIDER_SITE_OTHER): Payer: BLUE CROSS/BLUE SHIELD | Admitting: Psychiatry

## 2014-04-26 ENCOUNTER — Encounter (HOSPITAL_COMMUNITY): Payer: Self-pay | Admitting: Psychiatry

## 2014-04-26 VITALS — BP 116/75 | HR 67 | Ht 61.0 in | Wt 179.0 lb

## 2014-04-26 DIAGNOSIS — F411 Generalized anxiety disorder: Secondary | ICD-10-CM

## 2014-04-26 DIAGNOSIS — F39 Unspecified mood [affective] disorder: Secondary | ICD-10-CM | POA: Diagnosis not present

## 2014-04-26 DIAGNOSIS — F329 Major depressive disorder, single episode, unspecified: Secondary | ICD-10-CM

## 2014-04-26 DIAGNOSIS — F32A Depression, unspecified: Secondary | ICD-10-CM

## 2014-04-26 MED ORDER — CLONAZEPAM 1 MG PO TABS: 1.0000 mg | ORAL_TABLET | Freq: Two times a day (BID) | ORAL | Status: DC

## 2014-04-26 MED ORDER — ZOLPIDEM TARTRATE 10 MG PO TABS: 10.0000 mg | ORAL_TABLET | Freq: Every evening | ORAL | Status: DC | PRN

## 2014-04-26 MED ORDER — BUPROPION HCL ER (XL) 150 MG PO TB24: 150.0000 mg | ORAL_TABLET | ORAL | Status: DC

## 2014-04-26 MED ORDER — ESCITALOPRAM OXALATE 20 MG PO TABS: 20.0000 mg | ORAL_TABLET | Freq: Every day | ORAL | Status: DC

## 2014-04-26 NOTE — Progress Notes (Signed)
Patient ID: Makayla Wilson, female   DOB: 1975/07/21, 39 y.o.   MRN: [id number] Patient ID: Makayla Wilson, female   DOB: 1975/07/21, 39 y.o.   MRN: [id number] Patient ID: Makayla Wilson, female   DOB: 1975/07/21, 39 y.o.   MRN: [id number] Patient ID: Makayla Wilson, female   DOB: 1975/07/21, 39 y.o.   MRN: [id number] Patient ID: Makayla Wilson, female   DOB: 1975/07/21, 39 y.o.   MRN: [id number] Patient ID: Makayla Wilson, female   DOB: 1975/07/21, 39 y.o.   MRN: [id number] Patient ID: Makayla Wilson, female   DOB: 1975/07/21, 39 y.o.   MRN: [id number]  Psychiatric Assessment Adult  Patient Identification:  Makayla Wilson Date of Evaluation:  04/26/2014 Chief Complaint: "I've been stressed lately History of Chief Complaint:   Chief Complaint  Patient presents with  . Depression  . Anxiety  . Follow-up    Anxiety Symptoms include nervous/anxious behavior.     this patient is a 39 year old married white female who lives with her husband and 3 girls ages 6120,18 and 4412 [place]. 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  She states that she's always been a worrying type person. She is the youngest of 3 children and she is always been the most responsible child in the family. Her father took care of everyone in the family but he died 10 years ago and she had to do CPR to try to revive him. She still somewhat blames herself for his death.  The patient states that her mother brother and sister all live very close to her. They depend on her great deal as does her grandmother. Her mother has several health problems and she won't get medical care. The patient worries a good deal about her kids as well. Her 39 year old has always excelled at cheerleading and is currently at [employer]. She plans to transfer  to [employer] on a cheerleading scholarship. However she told the patient this past weekend that she doesn't  want to do this anymore. Her middle daughter is a Advertising account plannerballet dancer and is very serious about it. She recently told her mother that she might be gay.  The patient has been on Lexapro for several years but recently lost the bottle. She states this might of contributed to the "meltdown" she had a few days ago after she found out about the news from both daughters. She was crying and somewhat hysterical which is very unlike her. She's always very controlled and contained. Is very hard for her to say no to anyone in the family and she puts herself last. She's been crying a lot more, nervous and anxious having occasional panic attacks. She denies suicidal ideation. She does not have any psychotic symptoms and does not use drugs or alcohol. She always sleeps 3 or 4 hours per night  The patient returns after 3 months. She has been more frustrated recently because she got placed in "corrective action" at work. She is now off of it but it really affected her. Her 2 oldest daughters are struggling to determine what they wanted do academically and she is very frustrated with them as well. She's had little libido lately and feels more overwhelmed. She thinks the medicines are helping for the most part but counseling would probably help her again   Review of Systems  Constitutional: Negative.   HENT: Negative.   Eyes: Negative.  Respiratory: Negative.   Cardiovascular: Negative.   Gastrointestinal: Negative.   Endocrine: Negative.   Genitourinary: Negative.   Musculoskeletal: Negative.   Allergic/Immunologic: Negative.   Neurological: Negative.   Hematological: Negative.   Psychiatric/Behavioral: Positive for sleep disturbance and dysphoric mood. The patient is nervous/anxious.    Physical Exam not done  Depressive Symptoms: depressed mood, anhedonia, feelings of worthlessness/guilt, anxiety, panic attacks, insomnia,  (Hypo) Manic Symptoms:   Elevated Mood:  No Irritable Mood:  Yes Grandiosity:   No Distractibility:  No Labiality of Mood:  No Delusions:  No Hallucinations:  No Impulsivity:  No Sexually Inappropriate Behavior:  No Financial Extravagance:  No Flight of Ideas:  No  Anxiety Symptoms: Excessive Worry:  Yes Panic Symptoms:  Yes Agoraphobia:  No Obsessive Compulsive: No  Symptoms: None, Specific Phobias:  No Social Anxiety:  No  Psychotic Symptoms:  Hallucinations: No None Delusions:  No Paranoia:  No   Ideas of Reference:  No  PTSD Symptoms: Ever had a traumatic exposure:  No Had a traumatic exposure in the last month:  No Re-experiencing: No None Hypervigilance:  No Hyperarousal: No None Avoidance: No None  Traumatic Brain Injury: No   Past Psychiatric History: Diagnosis: Depression   Hospitalizations: None   Outpatient Care: Only through primary care providers   Substance Abuse Care: none  Self-Mutilation: none  Suicidal Attempts: none  Violent Behaviors: none   Past Medical History:   Past Medical History  Diagnosis Date  . Essential hypertension, benign   . Anxiety   . Depression   . GERD (gastroesophageal reflux disease)   . PONV (postoperative nausea and vomiting)    History of Loss of Consciousness:  No Seizure History:  No Cardiac History:  No Allergies:   Allergies  Allergen Reactions  . Morphine And Related Hives  . Neurontin [Gabapentin]   . Other     Glue on Johnson&Johnson bandaids. Caused severe redness and reaction   Current Medications:  Current Outpatient Prescriptions  Medication Sig Dispense Refill  . buPROPion (WELLBUTRIN XL) 150 MG 24 hr tablet Take 1 tablet (150 mg total) by mouth every morning. 30 tablet 2  . ciprofloxacin (CIPRO) 500 MG tablet Take 500 mg by mouth as directed.    . clonazePAM (KLONOPIN) 1 MG tablet Take 1 tablet (1 mg total) by mouth 2 (two) times daily. 60 tablet 2  . escitalopram (LEXAPRO) 20 MG tablet Take 1 tablet (20 mg total) by mouth daily. 30 tablet 2  . ibuprofen (ADVIL,MOTRIN)  400 MG tablet Take 400 mg by mouth every 6 (six) hours as needed for moderate pain.    Marland Kitchen olmesartan-hydrochlorothiazide (BENICAR HCT) 20-12.5 MG per tablet Take 1 tablet by mouth daily.    . Omega-3 Fatty Acids (FISH OIL) 1000 MG CAPS Take 1,000 mg by mouth daily.    Marland Kitchen omeprazole (PRILOSEC) 20 MG capsule Take 1 capsule (20 mg total) by mouth daily. 30 capsule 0  . OVER THE COUNTER MEDICATION Take 1 capsule by mouth daily. OTC biocell    . zolpidem (AMBIEN) 10 MG tablet Take 1 tablet (10 mg total) by mouth at bedtime as needed for sleep. 30 tablet 2   No current facility-administered medications for this visit.    Previous Psychotropic Medications:  Medication Dose   Lexapro   20 mg daily                      Substance Abuse History in the last 12 months: Substance Age  of 1st Use Last Use Amount Specific Type  Nicotine      Alcohol      Cannabis      Opiates      Cocaine      Methamphetamines      LSD      Ecstasy      Benzodiazepines      Caffeine      Inhalants      Others:                          Medical Consequences of Substance Abuse: n/a  Legal Consequences of Substance Abuse: n/a  Family Consequences of Substance Abuse: n/a  Blackouts:  No DT's:  No Withdrawal Symptoms:  No None  Social History: Current Place of Residence: [place] [address] of Birth: [place] Family Members: Husband, 3 daughter Marital Status:  Married Children:   Sons:   Daughters: 3 Relationships:  Education:  Corporate treasurer Problems/Performance:  Religious Beliefs/Practices: Christian History of Abuse: none Armed forces technical officer; 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 Legal History: none Hobbies/Interests: Travel, scuba diving  Family History:   Family History  Problem Relation Age of Onset  . CAD Father     Premature  . Anxiety disorder Father   . CAD Mother     Premature  . Depression Other   .  Depression Sister     Mental Status Examination/Evaluation: Objective:  Appearance: Casual and Well Groomed  Eye Contact::  Good  Speech:  Clear and Coherent  Volume:  Decreased  Mood: Somewhat depressed   Affect:  Anxious   Thought Process:  Goal Directed  Orientation:  Full (Time, Place, and Person)  Thought Content:  WDL  Suicidal Thoughts:  No  Homicidal Thoughts:  No  Judgement:  Good  Insight:  Good  Psychomotor Activity:  Normal  Akathisia:  No  Handed:  Right  AIMS (if indicated):    Assets:  Communication Skills Desire for Improvement Talents/Skills    Laboratory/X-Ray Psychological Evaluation(s)        Assessment:  Axis I: Generalized Anxiety Disorder and Mood Disorder NOS  AXIS I Generalized Anxiety Disorder and Mood Disorder NOS  AXIS II Deferred  AXIS III Past Medical History  Diagnosis Date  . Essential hypertension, benign   . Anxiety   . Depression   . GERD (gastroesophageal reflux disease)   . PONV (postoperative nausea and vomiting)      AXIS IV problems with primary support group  AXIS V 61-70 mild symptoms   Treatment Plan/Recommendations:  Plan of Care: Medication management   Laboratory:    Psychotherapy: She will restart therapy with Makayla Wilson here   Medications: She will continue Lexapro 20 mg every morning,twice a day and Wellbutrin XL 150 mg every morning and Ambien 10 mg each bedtime . she'll increase clonazepam to 1 mg twice a day   Routine PRN Medications:  No  Consultations:   Safety Concerns:    Other: She will return in 2 months     Diannia Ruder, MD 4/25/20169:05 AM

## 2014-04-27 ENCOUNTER — Ambulatory Visit (HOSPITAL_COMMUNITY): Payer: Self-pay | Admitting: Psychiatry

## 2014-04-27 ENCOUNTER — Telehealth (HOSPITAL_COMMUNITY): Payer: Self-pay | Admitting: *Deleted

## 2014-05-03 ENCOUNTER — Ambulatory Visit (INDEPENDENT_AMBULATORY_CARE_PROVIDER_SITE_OTHER): Payer: BLUE CROSS/BLUE SHIELD | Admitting: Psychiatry

## 2014-05-03 DIAGNOSIS — F32A Depression, unspecified: Secondary | ICD-10-CM

## 2014-05-03 DIAGNOSIS — F329 Major depressive disorder, single episode, unspecified: Secondary | ICD-10-CM

## 2014-05-03 NOTE — Patient Instructions (Signed)
Discussed orally 

## 2014-05-03 NOTE — Progress Notes (Signed)
   THERAPIST PROGRESS NOTE  Session Time:  Monday 05/03/2014 1:10 PM - 2:10 PM  Participation Level: Active  Behavioral Response: Well GroomedAlertAnxious and Depressed  Type of Therapy: Individual Therapy  Treatment Goals addressed:  Improve ability to to mange stress with decreased intensity/ frequency of anxiety response and decreased emotional outbursts      Develop realistic expectations of self and others      Improve mood experiencing increased motivation and resuming normal interest in activities.  Interventions: Supportive  Summary: Makayla Wilson is a 39 y.o. female who presents with symptoms of anxiety and depression that initially began when her father died about 8-1/2 years ago after becoming ill at home where patient gave him CPR. She continues to experience nightmares regarding this. Symptoms appear to have been exacerbated this past weekend after learning one of her daughters is gay and another daughter has decided not to pursue previously agreed upon goals. Patient states having a mental breakdown and currently is experiencing  depressed mood, crying spells, anxiety, loss of appetite, difficulty falling and staying asleep (2-3 hours of sleep per night), memory difficulty, loss of interest in activities, irritability, excessive worrying, low-energy, poor concentration, loss of libido, and memory difficulty.  Patient last was seen in April 2015. She is resuming services today per psychiatrist Dr. Charlott Rakesoss's recommendation as patient is experiencing increased symptoms of depression along with irritability and anger outburst. Patient reports multiple stressors. She is frustrated with her job as she reports having no trust in any of the members on her team. She reports a coworker with whom she had conflict 2 years ago has advanced to team captain and patient hasn't. Patient reports being more knowledgeable of the current program and says her coworker secretly sought patient for  information regarding the program prior to her promotion. She also reports stress and disappointment related to her 39 year old daughter who has not applied herself in school and has missed several dance lessons. Patient reports additional stress related to her brother-in-law and his 2 daughters, ages 5213 and 2015, residing with patient and her family for the past 6 months. Patient reports brother is addicted to prescription pain peels and stays in his room when he isn't working. She reports his children will not obey her rules and often have conflicts with patient's children. Patient expresses frustration with husband as he does not support or advocate for her in dealing with his brother and brothers children. Patient reports states staying and angry all the time and having anger outbursts about 5 times a day.    Suicidal/Homicidal: She reports having fleeting suicidal ideations this past weekend but says she would not do anything to harm self due to her spirituality. She agrees to call this practice, call 911, I have someone take her to the emergency room should symptoms worsen.  Therapist Response: Therapist works with patient to identify stressors, identifying verbalize feelings, identify ways for patient to have relaxation and quiet time for self, identify ways to improve self-care.  Plan: Return again in 2 weeks. Patient agrees to practice diaphragmatic breathing daily and to schedule time for self  Diagnosis: Axis I: Depressive Disorder NOS and Generalized Anxiety Disorder    Axis II: Deferred    Delisa Finck, LCSW 05/03/2014

## 2014-05-25 ENCOUNTER — Ambulatory Visit (INDEPENDENT_AMBULATORY_CARE_PROVIDER_SITE_OTHER): Payer: BLUE CROSS/BLUE SHIELD | Admitting: Psychiatry

## 2014-05-25 DIAGNOSIS — F329 Major depressive disorder, single episode, unspecified: Secondary | ICD-10-CM

## 2014-05-25 DIAGNOSIS — F32A Depression, unspecified: Secondary | ICD-10-CM

## 2014-05-27 ENCOUNTER — Other Ambulatory Visit (HOSPITAL_COMMUNITY): Payer: Self-pay | Admitting: Psychiatry

## 2014-05-27 NOTE — Patient Instructions (Signed)
Discussed orally 

## 2014-05-27 NOTE — Progress Notes (Signed)
   THERAPIST PROGRESS NOTE  Session Time:  Tuesday 05/25/2014 4:15 PM - 4:55 PM  Participation Level: Active  Behavioral Response: Well GroomedAlertAnxious and Depressed  Type of Therapy: Individual Therapy  Treatment Goals addressed:  Improve ability to to mange stress with decreased intensity/ frequency of anxiety response and decreased emotional outbursts      Develop realistic expectations of self and others      Improve mood experiencing increased motivation and resuming normal interest in activities.  Interventions: Supportive  Summary: Makayla SloughMelissa F Wilson is a 39 y.o. female who presents with symptoms of anxiety and depression that initially began when her father died about 8-1/2 years ago after becoming ill at home where patient gave him CPR. She continues to experience nightmares regarding this. Symptoms appear to have been exacerbated this past weekend after learning one of her daughters is gay and another daughter has decided not to pursue previously agreed upon goals. Patient states having a mental breakdown and currently is experiencing  depressed mood, crying spells, anxiety, loss of appetite, difficulty falling and staying asleep (2-3 hours of sleep per night), memory difficulty, loss of interest in activities, irritability, excessive worrying, low-energy, poor concentration, loss of libido, and memory difficulty.  Patient re[ports feeling much better since last session. She states talking to her supervisors regarding her concerns about her job and says they were supportive and understanding. She also talked with husband about concerns regarding his brother and brother's children. She and husband talked with the brother who has said he is looking for a place to stay. Patient and husband also set boundaries regarding their involvement and responsibility with brother's children. Patient reports feeling much better as she has let some things go. She also reports less stress regarding  daughter as daughter has been more invested in her school work and dance classes. Patient reports reading her bible, writing down her thoughts have been helpful. She reports continued sleep difficulty and states sleeping about 3 hours per night. She also is experiencing excessive appetite.   Suicidal/Homicidal:  No  Therapist Response: Therapist works with patient to process feelings, encourage patient to continue to improve self-care, begin to discuss connection between thoughts/mood/behavior, begin to explore coping statements  Plan: Return again in 2 weeks.   Diagnosis: Axis I: Depressive Disorder NOS and Generalized Anxiety Disorder    Axis II: Deferred    Letonya Mangels, LCSW 05/27/2014

## 2014-06-08 ENCOUNTER — Ambulatory Visit (HOSPITAL_COMMUNITY): Payer: Self-pay | Admitting: Psychiatry

## 2014-06-22 ENCOUNTER — Ambulatory Visit (INDEPENDENT_AMBULATORY_CARE_PROVIDER_SITE_OTHER): Payer: BLUE CROSS/BLUE SHIELD | Admitting: Psychiatry

## 2014-06-22 DIAGNOSIS — F329 Major depressive disorder, single episode, unspecified: Secondary | ICD-10-CM | POA: Diagnosis not present

## 2014-06-22 DIAGNOSIS — F32A Depression, unspecified: Secondary | ICD-10-CM

## 2014-06-22 NOTE — Patient Instructions (Signed)
Discussed orally 

## 2014-06-22 NOTE — Progress Notes (Signed)
   THERAPIST PROGRESS NOTE  Session Time:  Tuesday 06/22/2014 1:10 PM - 1:55 PM  Participation Level: Active  Behavioral Response: Well GroomedAlertAnxious and Depressed  Type of Therapy: Individual Therapy  Treatment Goals    1. Improve ability to to mange stress with decreased intensity/ frequency of anxiety response and decreased emotional outbursts      2. Develop realistic expectations of self and others      3. Improve mood experiencing increased motivation and resuming normal interest in activities.  Treatment Goals addressed:  1,2  Interventions: Supportive,CBT  Summary: Makayla Wilson is a 39 y.o. female who presents with symptoms of anxiety and depression that initially began when her father died about 8-1/2 years ago after becoming ill at home where patient gave him CPR. She continues to experience nightmares regarding this. Symptoms appear to have been exacerbated this past weekend after learning one of her daughters is gay and another daughter has decided not to pursue previously agreed upon goals. Patient states having a mental breakdown and currently is experiencing  depressed mood, crying spells, anxiety, loss of appetite, difficulty falling and staying asleep (2-3 hours of sleep per night), memory difficulty, loss of interest in activities, irritability, excessive worrying, low-energy, poor concentration, loss of libido, and memory difficulty.  Patient reports feeling much better last week as she and husband were home alone for the week while the other family members were away at the beach. She reports becoming stressed and angry as soon as they returned as she became upset with nieces who left dirty dishes in the the room and failed to take responsibility for their actions. She reports having emotional outburst yelling and making negative comments. Her husband has given his brother a deadline of the end of June to come up with plan to move out of the house with his  children. Patient reports sometimes being afraid of self when she becomes angry. She shares more information regarding anger and states feeling she has no one to calm her down since her father died 10 years ago. She reports she had to become the one to hold the family together after his death and states she then started holding in her feelings.  Suicidal/Homicidal:  No  Therapist Response: Therapist works with patient to process feelings,identify realistic expectations of interaction with her nieces, discuss her pattern of coping with feelings, identify triggers/early warning signs of anger and ways to intervene  Plan: Return again in 2 weeks.   Diagnosis: Axis I: Depressive Disorder NOS and Generalized Anxiety Disorder    Axis II: Deferred    BYNUM,PEGGY, LCSW 06/22/2014

## 2014-06-25 ENCOUNTER — Encounter (HOSPITAL_COMMUNITY): Payer: Self-pay | Admitting: Psychiatry

## 2014-06-25 ENCOUNTER — Ambulatory Visit (INDEPENDENT_AMBULATORY_CARE_PROVIDER_SITE_OTHER): Payer: BLUE CROSS/BLUE SHIELD | Admitting: Psychiatry

## 2014-06-25 VITALS — BP 100/80 | Ht 61.0 in | Wt 170.0 lb

## 2014-06-25 DIAGNOSIS — F411 Generalized anxiety disorder: Secondary | ICD-10-CM | POA: Diagnosis not present

## 2014-06-25 DIAGNOSIS — F39 Unspecified mood [affective] disorder: Secondary | ICD-10-CM | POA: Diagnosis not present

## 2014-06-25 DIAGNOSIS — F32A Depression, unspecified: Secondary | ICD-10-CM

## 2014-06-25 DIAGNOSIS — F329 Major depressive disorder, single episode, unspecified: Secondary | ICD-10-CM

## 2014-06-25 MED ORDER — BUPROPION HCL ER (XL) 150 MG PO TB24: 150.0000 mg | ORAL_TABLET | ORAL | Status: DC

## 2014-06-25 MED ORDER — ZOLPIDEM TARTRATE 10 MG PO TABS: 10.0000 mg | ORAL_TABLET | Freq: Every evening | ORAL | Status: DC | PRN

## 2014-06-25 MED ORDER — ESCITALOPRAM OXALATE 20 MG PO TABS: 20.0000 mg | ORAL_TABLET | Freq: Every day | ORAL | Status: DC

## 2014-06-25 MED ORDER — CLONAZEPAM 1 MG PO TABS: 1.0000 mg | ORAL_TABLET | Freq: Two times a day (BID) | ORAL | Status: DC

## 2014-06-25 NOTE — Progress Notes (Signed)
Patient ID: Makayla Wilson, female   DOB: August 19, 1975, 39 y.o.   MRN: [id number] Patient ID: Makayla Wilson, female   DOB: [birth year], 39 y.o.   MRN: [id number] Patient ID: Makayla Wilson, female   DOB: 03/29/1975, 39 y.o.   MRN: [id number] Patient ID: Makayla Wilson, female   DOB: 1975-09-20, 39 y.o.   MRN: [id number] Patient ID: Makayla Wilson, female   DOB: May 22, 1975, 39 y.o.   MRN: [id number] Patient ID: Makayla Wilson, female   DOB: [birth year], 39 y.o.   MRN: [id number] Patient ID: Makayla Wilson, female   DOB: [birth year], 39 y.o.   MRN: [id number] Patient ID: Makayla Wilson, female   DOB: January 05, 1975, 39 y.o.   MRN: [id number]  Psychiatric Assessment Adult  Patient Identification:  Makayla Wilson Date of Evaluation:  06/25/2014 Chief Complaint: "I've been stressed lately History of Chief Complaint:   Chief Complaint  Patient presents with  . Depression  . Anxiety  . Follow-up    Anxiety Symptoms include nervous/anxious behavior.     this patient is a 39 year old married white female who lives with her husband and 3 girls ages 37,18 and 1 in [place]. 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  She states that she's always been a worrying type person. She is the youngest of 3 children and she is always been the most responsible child in the family. Her father took care of everyone in the family but he died 10 years ago and she had to do CPR to try to revive him. She still somewhat blames herself for his death.  The patient states that her mother brother and sister all live very close to her. They depend on her great deal as does her grandmother. Her mother has several health problems and she won't get medical care. The patient worries a good deal about her kids as well. Her 97 year old has always excelled at cheerleading and is currently at [employer]. She plans to transfer  to [employer] on a  cheerleading scholarship. However she told the patient this past weekend that she doesn't want to do this anymore. Her middle daughter is a Advertising account planner and is very serious about it. She recently told her mother that she might be gay.  The patient has been on Lexapro for several years but recently lost the bottle. She states this might of contributed to the "meltdown" she had a few days ago after she found out about the news from both daughters. She was crying and somewhat hysterical which is very unlike her. She's always very controlled and contained. Is very hard for her to say no to anyone in the family and she puts herself last. She's been crying a lot more, nervous and anxious having occasional panic attacks. She denies suicidal ideation. She does not have any psychotic symptoms and does not use drugs or alcohol. She always sleeps 3 or 4 hours per night  The patient returns after 2 months. She is stressed because her brother-in-law and his 2 teenage daughters moved in several months ago. He is addicted to narcotics and is not going to his job. He his daughters "trashed my house". She is asked her husband to make them leave but he refuses. She is trying to stay, and work with her therapist Florencia Reasons regarding this. She is doing well at her job and her children continue to do well. She feels  like her medications are helping her mood and anxiety and sleep under the circumstances. She denies suicidal ideation   Review of Systems  Constitutional: Negative.   HENT: Negative.   Eyes: Negative.   Respiratory: Negative.   Cardiovascular: Negative.   Gastrointestinal: Negative.   Endocrine: Negative.   Genitourinary: Negative.   Musculoskeletal: Negative.   Allergic/Immunologic: Negative.   Neurological: Negative.   Hematological: Negative.   Psychiatric/Behavioral: Positive for sleep disturbance and dysphoric mood. The patient is nervous/anxious.    Physical Exam not done  Depressive Symptoms:  depressed mood, anhedonia, feelings of worthlessness/guilt, anxiety, panic attacks, insomnia,  (Hypo) Manic Symptoms:   Elevated Mood:  No Irritable Mood:  Yes Grandiosity:  No Distractibility:  No Labiality of Mood:  No Delusions:  No Hallucinations:  No Impulsivity:  No Sexually Inappropriate Behavior:  No Financial Extravagance:  No Flight of Ideas:  No  Anxiety Symptoms: Excessive Worry:  Yes Panic Symptoms:  Yes Agoraphobia:  No Obsessive Compulsive: No  Symptoms: None, Specific Phobias:  No Social Anxiety:  No  Psychotic Symptoms:  Hallucinations: No None Delusions:  No Paranoia:  No   Ideas of Reference:  No  PTSD Symptoms: Ever had a traumatic exposure:  No Had a traumatic exposure in the last month:  No Re-experiencing: No None Hypervigilance:  No Hyperarousal: No None Avoidance: No None  Traumatic Brain Injury: No   Past Psychiatric History: Diagnosis: Depression   Hospitalizations: None   Outpatient Care: Only through primary care providers   Substance Abuse Care: none  Self-Mutilation: none  Suicidal Attempts: none  Violent Behaviors: none   Past Medical History:   Past Medical History  Diagnosis Date  . Essential hypertension, benign   . Anxiety   . Depression   . GERD (gastroesophageal reflux disease)   . PONV (postoperative nausea and vomiting)    History of Loss of Consciousness:  No Seizure History:  No Cardiac History:  No Allergies:   Allergies  Allergen Reactions  . Morphine And Related Hives  . Neurontin [Gabapentin]   . Other     Glue on [employer] bandaids. Caused severe redness and reaction   Current Medications:  Current Outpatient Prescriptions  Medication Sig Dispense Refill  . buPROPion (WELLBUTRIN XL) 150 MG 24 hr tablet Take 1 tablet (150 mg total) by mouth every morning. 30 tablet 2  . ciprofloxacin (CIPRO) 500 MG tablet Take 500 mg by mouth as directed.    . clonazePAM (KLONOPIN) 1 MG tablet Take 1  tablet (1 mg total) by mouth 2 (two) times daily. 60 tablet 2  . escitalopram (LEXAPRO) 20 MG tablet Take 1 tablet (20 mg total) by mouth daily. 30 tablet 2  . ibuprofen (ADVIL,MOTRIN) 400 MG tablet Take 400 mg by mouth every 6 (six) hours as needed for moderate pain.    Marland Kitchen olmesartan-hydrochlorothiazide (BENICAR HCT) 20-12.5 MG per tablet Take 1 tablet by mouth daily.    . Omega-3 Fatty Acids (FISH OIL) 1000 MG CAPS Take 1,000 mg by mouth daily.    Marland Kitchen omeprazole (PRILOSEC) 20 MG capsule Take 1 capsule (20 mg total) by mouth daily. 30 capsule 0  . OVER THE COUNTER MEDICATION Take 1 capsule by mouth daily. OTC biocell    . zolpidem (AMBIEN) 10 MG tablet Take 1 tablet (10 mg total) by mouth at bedtime as needed for sleep. 30 tablet 2   No current facility-administered medications for this visit.    Previous Psychotropic Medications:  Medication Dose   Lexapro  20 mg daily                      Substance Abuse History in the last 12 months: Substance Age of 1st Use Last Use Amount Specific Type  Nicotine      Alcohol      Cannabis      Opiates      Cocaine      Methamphetamines      LSD      Ecstasy      Benzodiazepines      Caffeine      Inhalants      Others:                          Medical Consequences of Substance Abuse: n/a  Legal Consequences of Substance Abuse: n/a  Family Consequences of Substance Abuse: n/a  Blackouts:  No DT's:  No Withdrawal Symptoms:  No None  Social History: Current Place of Residence: [place] [address] of Birth: [place] Family Members: Husband, 3 daughter Marital Status:  Married Children:   Sons:   Daughters: 3 Relationships:  Education:  Corporate treasurer Problems/Performance:  Religious Beliefs/Practices: Christian History of Abuse: none Armed forces technical officer; 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 Legal History: none Hobbies/Interests: Travel, scuba  diving  Family History:   Family History  Problem Relation Age of Onset  . CAD Father     Premature  . Anxiety disorder Father   . CAD Mother     Premature  . Depression Other   . Depression Sister     Mental Status Examination/Evaluation: Objective:  Appearance: Casual and Well Groomed  Eye Contact::  Good  Speech:  Clear and Coherent  Volume:  Decreased  Mood: Somewhat anxious   Affect:  Mildly constricted   Thought Process:  Goal Directed  Orientation:  Full (Time, Place, and Person)  Thought Content:  WDL  Suicidal Thoughts:  No  Homicidal Thoughts:  No  Judgement:  Good  Insight:  Good  Psychomotor Activity:  Normal  Akathisia:  No  Handed:  Right  AIMS (if indicated):    Assets:  Communication Skills Desire for Improvement Talents/Skills    Laboratory/X-Ray Psychological Evaluation(s)        Assessment:  Axis I: Generalized Anxiety Disorder and Mood Disorder NOS  AXIS I Generalized Anxiety Disorder and Mood Disorder NOS  AXIS II Deferred  AXIS III Past Medical History  Diagnosis Date  . Essential hypertension, benign   . Anxiety   . Depression   . GERD (gastroesophageal reflux disease)   . PONV (postoperative nausea and vomiting)      AXIS IV problems with primary support group  AXIS V 61-70 mild symptoms   Treatment Plan/Recommendations:  Plan of Care: Medication management   Laboratory:    Psychotherapy: She wil into new therapy with Florencia Reasons here   Medications: She will continue Lexapro 20 mg every morning,twice a day and Wellbutrin XL 150 mg every morning her depression and Ambien 10 mg each bedtime for sleep. she'll increase clonazepam to 1 mg twice a day for anxiety   Routine PRN Medications:  No  Consultations:   Safety Concerns: She denies thoughts of self-harm   Other: She will return in 2 months     Diannia Ruder, MD 6/24/20169:12 AM

## 2014-08-11 ENCOUNTER — Ambulatory Visit (INDEPENDENT_AMBULATORY_CARE_PROVIDER_SITE_OTHER): Payer: BLUE CROSS/BLUE SHIELD | Admitting: Psychiatry

## 2014-08-11 ENCOUNTER — Encounter (HOSPITAL_COMMUNITY): Payer: Self-pay | Admitting: Psychiatry

## 2014-08-11 VITALS — BP 148/95 | HR 71 | Ht 61.0 in | Wt 174.4 lb

## 2014-08-11 DIAGNOSIS — F32A Depression, unspecified: Secondary | ICD-10-CM

## 2014-08-11 DIAGNOSIS — F39 Unspecified mood [affective] disorder: Secondary | ICD-10-CM | POA: Diagnosis not present

## 2014-08-11 DIAGNOSIS — F411 Generalized anxiety disorder: Secondary | ICD-10-CM | POA: Diagnosis not present

## 2014-08-11 DIAGNOSIS — F329 Major depressive disorder, single episode, unspecified: Secondary | ICD-10-CM

## 2014-08-11 MED ORDER — ALPRAZOLAM 1 MG PO TABS: 1.0000 mg | ORAL_TABLET | Freq: Four times a day (QID) | ORAL | Status: DC

## 2014-08-11 MED ORDER — ZOLPIDEM TARTRATE 10 MG PO TABS: 10.0000 mg | ORAL_TABLET | Freq: Every evening | ORAL | Status: DC | PRN

## 2014-08-11 MED ORDER — ESCITALOPRAM OXALATE 20 MG PO TABS: 20.0000 mg | ORAL_TABLET | Freq: Every day | ORAL | Status: DC

## 2014-08-11 MED ORDER — BUPROPION HCL ER (XL) 150 MG PO TB24: 150.0000 mg | ORAL_TABLET | ORAL | Status: DC

## 2014-08-11 NOTE — Progress Notes (Signed)
Patient ID: Makayla Wilson, female   DOB: 03-19-1975, 39 y.o.   MRN: [id number] Patient ID: Makayla Wilson, female   DOB: 1975-05-07, 39 y.o.   MRN: [id number] Patient ID: Makayla Wilson, female   DOB: 1975-04-01, 39 y.o.   MRN: [id number] Patient ID: Makayla Wilson, female   DOB: 08-15-1975, 39 y.o.   MRN: [id number] Patient ID: Makayla Wilson, female   DOB: [birth year], 39 y.o.   MRN: [id number] Patient ID: Makayla Wilson, female   DOB: [birth year], 39 y.o.   MRN: [id number] Patient ID: Makayla Wilson, female   DOB: 12-21-1975, 39 y.o.   MRN: [id number] Patient ID: Makayla Wilson, female   DOB: January 30, 1975, 39 y.o.   MRN: [id number] Patient ID: Makayla Wilson, female   DOB: 02-26-1975, 39 y.o.   MRN: [id number]  Psychiatric Assessment Adult  Patient Identification:  Makayla Wilson Date of Evaluation:  08/11/2014 Chief Complaint: "I've been stressed lately History of Chief Complaint:   Chief Complaint  Patient presents with  . Depression  . Anxiety  . Follow-up    Depression        Past medical history includes anxiety.   Anxiety Symptoms include nervous/anxious behavior.     this patient is a 39 year old married white female who lives with her husband and 3 girls ages 1,18 and 39 in [place]. 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  She states that she's always been a worrying type person. She is the youngest of 3 children and she is always been the most responsible child in the family. Her father took care of everyone in the family but he died 10 years ago and she had to do CPR to try to revive him. She still somewhat blames herself for his death.  The patient states that her mother brother and sister all live very close to her. They depend on her great deal as does her grandmother. Her mother has several health problems and she won't get medical care. The patient worries a good deal about her kids as  well. Her 39 year old has always excelled at cheerleading and is currently at [employer]. She plans to transfer  to [employer] on a cheerleading scholarship. However she told the patient this past weekend that she doesn't want to do this anymore. Her middle daughter is a Advertising account planner and is very serious about it. She recently told her mother that she might be gay.  The patient has been on Lexapro for several years but recently lost the bottle. She states this might of contributed to the "meltdown" she had a few days ago after she found out about the news from both daughters. She was crying and somewhat hysterical which is very unlike her. She's always very controlled and contained. Is very hard for her to say no to anyone in the family and she puts herself last. She's been crying a lot more, nervous and anxious having occasional panic attacks. She denies suicidal ideation. She does not have any psychotic symptoms and does not use drugs or alcohol. She always sleeps 3 or 4 hours per night  The patient returns after 2 months. She is seen as a work in today. She states that her life is gotten unbearably stressful and she is feeling panicky and jittery all the time. Her brother-in-law is still staying at the house and trying to go through drug rehabilitation. She doesn't want  him there but her husband will not put him out. His 2 kids are there as well. Job is very stressful and demanding right now. The clonazepam is really not doing much for her and we discussed trying a different medicine like Xanax and she is in agreement. She denies being suicidal but is just extremely anxious   Review of Systems  Constitutional: Negative.   HENT: Negative.   Eyes: Negative.   Respiratory: Negative.   Cardiovascular: Negative.   Gastrointestinal: Negative.   Endocrine: Negative.   Genitourinary: Negative.   Musculoskeletal: Negative.   Allergic/Immunologic: Negative.   Neurological: Negative.    Hematological: Negative.   Psychiatric/Behavioral: Positive for depression, sleep disturbance and dysphoric mood. The patient is nervous/anxious.    Physical Exam not done  Depressive Symptoms: depressed mood, anhedonia, feelings of worthlessness/guilt, anxiety, panic attacks, insomnia,  (Hypo) Manic Symptoms:   Elevated Mood:  No Irritable Mood:  Yes Grandiosity:  No Distractibility:  No Labiality of Mood:  No Delusions:  No Hallucinations:  No Impulsivity:  No Sexually Inappropriate Behavior:  No Financial Extravagance:  No Flight of Ideas:  No  Anxiety Symptoms: Excessive Worry:  Yes Panic Symptoms:  Yes Agoraphobia:  No Obsessive Compulsive: No  Symptoms: None, Specific Phobias:  No Social Anxiety:  No  Psychotic Symptoms:  Hallucinations: No None Delusions:  No Paranoia:  No   Ideas of Reference:  No  PTSD Symptoms: Ever had a traumatic exposure:  No Had a traumatic exposure in the last month:  No Re-experiencing: No None Hypervigilance:  No Hyperarousal: No None Avoidance: No None  Traumatic Brain Injury: No   Past Psychiatric History: Diagnosis: Depression   Hospitalizations: None   Outpatient Care: Only through primary care providers   Substance Abuse Care: none  Self-Mutilation: none  Suicidal Attempts: none  Violent Behaviors: none   Past Medical History:   Past Medical History  Diagnosis Date  . Essential hypertension, benign   . Anxiety   . Depression   . GERD (gastroesophageal reflux disease)   . PONV (postoperative nausea and vomiting)    History of Loss of Consciousness:  No Seizure History:  No Cardiac History:  No Allergies:   Allergies  Allergen Reactions  . Morphine And Related Hives  . Neurontin [Gabapentin]   . Other     Glue on [employer] bandaids. Caused severe redness and reaction   Current Medications:  Current Outpatient Prescriptions  Medication Sig Dispense Refill  . buPROPion (WELLBUTRIN XL) 150 MG  24 hr tablet Take 1 tablet (150 mg total) by mouth every morning. 30 tablet 2  . escitalopram (LEXAPRO) 20 MG tablet Take 1 tablet (20 mg total) by mouth daily. 30 tablet 2  . ibuprofen (ADVIL,MOTRIN) 400 MG tablet Take 400 mg by mouth every 6 (six) hours as needed for moderate pain.    Marland Kitchen olmesartan-hydrochlorothiazide (BENICAR HCT) 20-12.5 MG per tablet Take 1 tablet by mouth daily.    . Omega-3 Fatty Acids (FISH OIL) 1000 MG CAPS Take 1,000 mg by mouth daily.    Marland Kitchen omeprazole (PRILOSEC) 20 MG capsule Take 1 capsule (20 mg total) by mouth daily. 30 capsule 0  . OVER THE COUNTER MEDICATION Take 1 capsule by mouth daily. OTC biocell    . zolpidem (AMBIEN) 10 MG tablet Take 1 tablet (10 mg total) by mouth at bedtime as needed for sleep. 30 tablet 2  . ALPRAZolam (XANAX) 1 MG tablet Take 1 tablet (1 mg total) by mouth 4 (four) times daily.  120 tablet 2  . ciprofloxacin (CIPRO) 500 MG tablet Take 500 mg by mouth as directed.     No current facility-administered medications for this visit.    Previous Psychotropic Medications:  Medication Dose   Lexapro   20 mg daily                      Substance Abuse History in the last 12 months: Substance Age of 1st Use Last Use Amount Specific Type  Nicotine      Alcohol      Cannabis      Opiates      Cocaine      Methamphetamines      LSD      Ecstasy      Benzodiazepines      Caffeine      Inhalants      Others:                          Medical Consequences of Substance Abuse: n/a  Legal Consequences of Substance Abuse: n/a  Family Consequences of Substance Abuse: n/a  Blackouts:  No DT's:  No Withdrawal Symptoms:  No None  Social History: Current Place of Residence: [place] [address] of Birth: [place] Family Members: Husband, 3 daughter Marital Status:  Married Children:   Sons:   Daughters: 3 Relationships:  Education:  Corporate treasurer Problems/Performance:  Religious  Beliefs/Practices: Christian History of Abuse: none Armed forces technical officer; 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 Legal History: none Hobbies/Interests: Travel, scuba diving  Family History:   Family History  Problem Relation Age of Onset  . CAD Father     Premature  . Anxiety disorder Father   . CAD Mother     Premature  . Depression Other   . Depression Sister     Mental Status Examination/Evaluation: Objective:  Appearance: Casual and Well Groomed  Eye Contact::  Good  Speech:  Clear and Coherent  Volume:  Decreased  Mood: Anxious   Affect:  Shaky, appears very tired   Thought Process:  Goal Directed  Orientation:  Full (Time, Place, and Person)  Thought Content:  WDL  Suicidal Thoughts:  No  Homicidal Thoughts:  No  Judgement:  Good  Insight:  Good  Psychomotor Activity:  Normal  Akathisia:  No  Handed:  Right  AIMS (if indicated):    Assets:  Communication Skills Desire for Improvement Talents/Skills    Laboratory/X-Ray Psychological Evaluation(s)        Assessment:  Axis I: Generalized Anxiety Disorder and Mood Disorder NOS  AXIS I Generalized Anxiety Disorder and Mood Disorder NOS  AXIS II Deferred  AXIS III Past Medical History  Diagnosis Date  . Essential hypertension, benign   . Anxiety   . Depression   . GERD (gastroesophageal reflux disease)   . PONV (postoperative nausea and vomiting)      AXIS IV problems with primary support group  AXIS V 61-70 mild symptoms   Treatment Plan/Recommendations:  Plan of Care: Medication management   Laboratory:    Psychotherapy: She wil into new therapy with Florencia Reasons here   Medications: She will continue Lexapro 20 mg every morning,twice a day and Wellbutrin XL 150 mg every morning her depression and Ambien 10 mg each bedtime for sleep. she'll discontinue clonazepam and start Xanax 1 mg up to 4 times a day for anxiety   Routine PRN  Medications:  No  Consultations:    Safety Concerns: She denies thoughts of self-harm   Other: She will return in 4 weeks     Makayla Ruder, MD 8/10/20164:40 PM

## 2014-08-26 ENCOUNTER — Emergency Department (HOSPITAL_COMMUNITY): Payer: BLUE CROSS/BLUE SHIELD

## 2014-08-26 ENCOUNTER — Emergency Department (HOSPITAL_COMMUNITY)
Admission: EM | Admit: 2014-08-26 | Discharge: 2014-08-27 | Disposition: A | Discharge status: Home / Self Care | Payer: BLUE CROSS/BLUE SHIELD | Attending: Emergency Medicine | Admitting: Emergency Medicine

## 2014-08-26 ENCOUNTER — Encounter (HOSPITAL_COMMUNITY): Payer: Self-pay | Admitting: Emergency Medicine

## 2014-08-26 DIAGNOSIS — Z90710 Acquired absence of both cervix and uterus: Secondary | ICD-10-CM | POA: Diagnosis not present

## 2014-08-26 DIAGNOSIS — Z9889 Other specified postprocedural states: Secondary | ICD-10-CM | POA: Insufficient documentation

## 2014-08-26 DIAGNOSIS — R109 Unspecified abdominal pain: Secondary | ICD-10-CM | POA: Diagnosis present

## 2014-08-26 DIAGNOSIS — R509 Fever, unspecified: Secondary | ICD-10-CM | POA: Diagnosis not present

## 2014-08-26 DIAGNOSIS — F329 Major depressive disorder, single episode, unspecified: Secondary | ICD-10-CM | POA: Insufficient documentation

## 2014-08-26 DIAGNOSIS — R110 Nausea: Secondary | ICD-10-CM | POA: Diagnosis not present

## 2014-08-26 DIAGNOSIS — K219 Gastro-esophageal reflux disease without esophagitis: Secondary | ICD-10-CM | POA: Diagnosis not present

## 2014-08-26 DIAGNOSIS — I10 Essential (primary) hypertension: Secondary | ICD-10-CM | POA: Insufficient documentation

## 2014-08-26 DIAGNOSIS — F419 Anxiety disorder, unspecified: Secondary | ICD-10-CM | POA: Diagnosis not present

## 2014-08-26 DIAGNOSIS — R52 Pain, unspecified: Secondary | ICD-10-CM

## 2014-08-26 DIAGNOSIS — Z79899 Other long term (current) drug therapy: Secondary | ICD-10-CM | POA: Insufficient documentation

## 2014-08-26 LAB — CBC
HCT: 38.3 % (ref 36.0–46.0)
Hemoglobin: 13.3 g/dL (ref 12.0–15.0)
MCH: 30.3 pg (ref 26.0–34.0)
MCHC: 34.7 g/dL (ref 30.0–36.0)
MCV: 87.2 fL (ref 78.0–100.0)
Platelets: 264 10*3/uL (ref 150–400)
RBC: 4.39 MIL/uL (ref 3.87–5.11)
RDW: 13.1 % (ref 11.5–15.5)
WBC: 8.1 10*3/uL (ref 4.0–10.5)

## 2014-08-26 LAB — BASIC METABOLIC PANEL
Anion gap: 9 (ref 5–15)
BUN: 7 mg/dL (ref 6–20)
CHLORIDE: 96 mmol/L — AB (ref 101–111)
CO2: 27 mmol/L (ref 22–32)
Calcium: 9.2 mg/dL (ref 8.9–10.3)
Creatinine, Ser: 0.65 mg/dL (ref 0.44–1.00)
GFR calc Af Amer: >60 mL/min (ref >60)
GFR calc non Af Amer: >60 mL/min (ref >60)
Glucose, Bld: 93 mg/dL (ref 65–99)
POTASSIUM: 3.1 mmol/L — AB (ref 3.5–5.1)
SODIUM: 132 mmol/L — AB (ref 135–145)

## 2014-08-26 LAB — URINALYSIS, ROUTINE W REFLEX MICROSCOPIC
Bilirubin Urine: NEGATIVE
Glucose, UA: NEGATIVE mg/dL
Ketones, ur: NEGATIVE mg/dL
Leukocytes, UA: NEGATIVE
Nitrite: NEGATIVE
PH: 6.5 (ref 5.0–8.0)
Protein, ur: NEGATIVE mg/dL
SPECIFIC GRAVITY, URINE: 1.01 (ref 1.005–1.030)
UROBILINOGEN UA: 0.2 mg/dL (ref 0.0–1.0)

## 2014-08-26 LAB — URINE MICROSCOPIC-ADD ON

## 2014-08-26 MED ORDER — HYDROMORPHONE HCL 1 MG/ML IJ SOLN
INTRAMUSCULAR | Status: AC
  Administered 2014-08-26: 1 mg
  Filled 2014-08-26: qty 1

## 2014-08-26 MED ORDER — ONDANSETRON HCL 4 MG/2ML IJ SOLN
4.0000 mg | Freq: Once | INTRAMUSCULAR | Status: AC
  Administered 2014-08-26: 4 mg via INTRAVENOUS
  Filled 2014-08-26: qty 2

## 2014-08-26 MED ORDER — TRAMADOL HCL 50 MG PO TABS: 50.0000 mg | ORAL_TABLET | Freq: Four times a day (QID) | ORAL | Status: DC | PRN

## 2014-08-26 MED ORDER — KETOROLAC TROMETHAMINE 30 MG/ML IJ SOLN
30.0000 mg | Freq: Once | INTRAMUSCULAR | Status: AC
  Administered 2014-08-26: 30 mg via INTRAVENOUS
  Filled 2014-08-26: qty 1

## 2014-08-26 NOTE — ED Notes (Signed)
Started having aching pain in left flank that radiates around and down into left lower abd.  Started vomiting with pain last night.

## 2014-08-26 NOTE — ED Provider Notes (Signed)
CSN: [id number]   Arrival date & time 08/26/14 2039  History  This chart was scribed for Bethann Berkshire, MD by Bethel Born, ED Scribe. This patient was seen in room [facility] and the patient's care was started at 9:02 PM.  Chief Complaint  Patient presents with  . Flank Pain    Left    HPI Patient is a 39 y.o. female presenting with flank pain. The history is provided by the patient. No language interpreter was used.  Flank Pain This is a new problem. The current episode started more than 2 days ago. The problem occurs constantly. The problem has been gradually worsening. Pertinent negatives include no chest pain, no abdominal pain and no headaches. Nothing aggravates the symptoms. Nothing relieves the symptoms. She has tried nothing for the symptoms.   IONE SANDUSKY is a 39 y.o. female who presents to the Emergency Department complaining of constant left flank pain with onset 4-5 days ago. She rates the pain 10/10 in severity and describes it as sharp.  Associated symptoms include subjective fever, chills, and nausea. Pt denies difficulty urinating, dysuria, and hematuria. No history of kidney stones.    Past Medical History  Diagnosis Date  . Essential hypertension, benign   . Anxiety   . Depression   . GERD (gastroesophageal reflux disease)   . PONV (postoperative nausea and vomiting)     Past Surgical History  Procedure Laterality Date  . Cesarean section    . Abdominal hysterectomy    . Reconstruction adbominal wall    . Cyst removal trunk N/A 02/16/2013    Procedure: EXCISION SEBACEOUS CYST ABDOMINAL WALL;  Surgeon: Dalia Heading, MD;  Location: [facility];  Service: General;  Laterality: N/A;    Family History  Problem Relation Age of Onset  . CAD Father     Premature  . Anxiety disorder Father   . CAD Mother     Premature  . Depression Other   . Depression Sister     Social History  Substance Use Topics  . Smoking status: Never Smoker   . Smokeless tobacco:  Never Used  . Alcohol Use: Yes     Comment: Occasional     Review of Systems  Constitutional: Positive for fever and chills. Negative for appetite change and fatigue.  HENT: Negative for congestion, ear discharge and sinus pressure.   Eyes: Negative for discharge.  Respiratory: Negative for cough.   Cardiovascular: Negative for chest pain.  Gastrointestinal: Positive for nausea. Negative for abdominal pain and diarrhea.  Genitourinary: Positive for flank pain. Negative for dysuria, frequency, hematuria and difficulty urinating.  Musculoskeletal: Negative for back pain.  Skin: Negative for rash.  Neurological: Negative for seizures and headaches.  Psychiatric/Behavioral: Negative for hallucinations.    Home Medications   Prior to Admission medications   Medication Sig Start Date End Date Taking? Authorizing Provider  ALPRAZolam Prudy Feeler) 1 MG tablet Take 1 tablet (1 mg total) by mouth 4 (four) times daily. 08/11/14 08/11/15  Myrlene Broker, MD  buPROPion (WELLBUTRIN XL) 150 MG 24 hr tablet Take 1 tablet (150 mg total) by mouth every morning. 08/11/14 08/11/15  Myrlene Broker, MD  ciprofloxacin (CIPRO) 500 MG tablet Take 500 mg by mouth as directed.    Historical Provider, MD  escitalopram (LEXAPRO) 20 MG tablet Take 1 tablet (20 mg total) by mouth daily. 08/11/14 08/11/15  Myrlene Broker, MD  ibuprofen (ADVIL,MOTRIN) 400 MG tablet Take 400 mg by mouth every 6 (six) hours  as needed for moderate pain.    Historical Provider, MD  olmesartan-hydrochlorothiazide (BENICAR HCT) 20-12.5 MG per tablet Take 1 tablet by mouth daily.    Historical Provider, MD  Omega-3 Fatty Acids (FISH OIL) 1000 MG CAPS Take 1,000 mg by mouth daily.    Historical Provider, MD  omeprazole (PRILOSEC) 20 MG capsule Take 1 capsule (20 mg total) by mouth daily. 01/08/13   Gerhard Munch, MD  OVER THE COUNTER MEDICATION Take 1 capsule by mouth daily. OTC biocell    Historical Provider, MD  zolpidem (AMBIEN) 10 MG tablet Take 1  tablet (10 mg total) by mouth at bedtime as needed for sleep. 08/11/14 12/07/14  Myrlene Broker, MD    Allergies  Morphine and related; Neurontin; and Other  Triage Vitals: BP 126/70 mmHg  Temp(Src) 98.3 F (36.8 C) (Oral)  Resp 18  Ht 5\' 1"  (1.549 m)  Wt 175 lb (79.379 kg)  BMI 33.08 kg/m2  SpO2 100%  Physical Exam  Constitutional: She is oriented to person, place, and time. She appears well-developed.  HENT:  Head: Normocephalic.  Eyes: Conjunctivae and EOM are normal. No scleral icterus.  Neck: Neck supple. No thyromegaly present.  Cardiovascular: Normal rate and regular rhythm.  Exam reveals no gallop and no friction rub.   No murmur heard. Pulmonary/Chest: No stridor. She has no wheezes. She has no rales. She exhibits no tenderness.  Abdominal: She exhibits no distension. There is no tenderness. There is no rebound.  Musculoskeletal: Normal range of motion. She exhibits no edema.  Moderate left flank tenderness  Lymphadenopathy:    She has no cervical adenopathy.  Neurological: She is oriented to person, place, and time. She exhibits normal muscle tone. Coordination normal.  Skin: No rash noted. No erythema.  Psychiatric: She has a normal mood and affect. Her behavior is normal.  Nursing note and vitals reviewed.   ED Course  Procedures   DIAGNOSTIC STUDIES: Oxygen Saturation is 100% on RA, normal by my interpretation.    COORDINATION OF CARE: 9:06 PM Discussed treatment plan which includes lab work, Toradol, and Zofran with pt at bedside and pt agreed to plan.  Labs Reviewed  URINE CULTURE  URINALYSIS, ROUTINE W REFLEX MICROSCOPIC (NOT AT [facility])  BASIC METABOLIC PANEL  CBC    I, Bethann Berkshire, MD, personally reviewed and evaluated these images and lab results as part of my medical decision-making.  Imaging Review No results found.      MDM   Final diagnoses:  None     Pt with left flank pain,  Unremarkable labs and ct scan,  Most likely muscular  skeletal pain,  Will tx symptoms and have pt follow up with pcp.     Bethann Berkshire, MD 08/30/14 [phone]

## 2014-08-26 NOTE — Discharge Instructions (Signed)
Follow up with your md next week. °

## 2014-08-28 LAB — URINE CULTURE

## 2014-08-30 ENCOUNTER — Ambulatory Visit (HOSPITAL_COMMUNITY): Payer: Self-pay | Admitting: Psychiatry

## 2014-09-07 ENCOUNTER — Ambulatory Visit (HOSPITAL_COMMUNITY): Payer: Self-pay | Admitting: Psychiatry

## 2014-09-21 ENCOUNTER — Other Ambulatory Visit (HOSPITAL_COMMUNITY): Payer: Self-pay | Admitting: Psychiatry

## 2014-10-13 ENCOUNTER — Ambulatory Visit (INDEPENDENT_AMBULATORY_CARE_PROVIDER_SITE_OTHER): Payer: BLUE CROSS/BLUE SHIELD | Admitting: Psychiatry

## 2014-10-13 ENCOUNTER — Encounter (HOSPITAL_COMMUNITY): Payer: Self-pay | Admitting: Psychiatry

## 2014-10-13 VITALS — BP 126/79 | HR 90 | Ht 61.0 in | Wt 174.2 lb

## 2014-10-13 DIAGNOSIS — F32A Depression, unspecified: Secondary | ICD-10-CM

## 2014-10-13 DIAGNOSIS — F411 Generalized anxiety disorder: Secondary | ICD-10-CM

## 2014-10-13 DIAGNOSIS — F39 Unspecified mood [affective] disorder: Secondary | ICD-10-CM | POA: Diagnosis not present

## 2014-10-13 DIAGNOSIS — F329 Major depressive disorder, single episode, unspecified: Secondary | ICD-10-CM

## 2014-10-13 MED ORDER — BUPROPION HCL ER (XL) 150 MG PO TB24: 150.0000 mg | ORAL_TABLET | ORAL | Status: DC

## 2014-10-13 MED ORDER — CLONAZEPAM 0.5 MG PO TABS
0.5000 mg | ORAL_TABLET | Freq: Two times a day (BID) | ORAL | Status: DC | PRN
Start: 2014-10-13 — End: 2015-01-25

## 2014-10-13 MED ORDER — ZOLPIDEM TARTRATE 10 MG PO TABS: 10.0000 mg | ORAL_TABLET | Freq: Every evening | ORAL | Status: DC | PRN

## 2014-10-13 MED ORDER — ESCITALOPRAM OXALATE 20 MG PO TABS: 20.0000 mg | ORAL_TABLET | Freq: Every day | ORAL | Status: DC

## 2014-10-13 NOTE — Progress Notes (Signed)
Patient ID: Makayla Wilson, female   DOB: Mar 15, 1975, 39 y.o.   MRN: [id number] Patient ID: Makayla Wilson, female   DOB: Mar 15, 1975, 39 y.o.   MRN: [id number] Patient ID: Makayla Wilson, female   DOB: Mar 15, 1975, 39 y.o.   MRN: [id number] Patient ID: Makayla Wilson, female   DOB: Mar 15, 1975, 39 y.o.   MRN: [id number] Patient ID: Makayla Wilson, female   DOB: Mar 15, 1975, 39 y.o.   MRN: [id number] Patient ID: Makayla Wilson, female   DOB: Mar 15, 1975, 39 y.o.   MRN: [id number] Patient ID: Makayla Wilson, female   DOB: Mar 15, 1975, 39 y.o.   MRN: [id number] Patient ID: Makayla Wilson, female   DOB: Mar 15, 1975, 39 y.o.   MRN: [id number] Patient ID: Makayla Wilson, female   DOB: Mar 15, 1975, 39 y.o.   MRN: [id number] Patient ID: Makayla Wilson, female   DOB: Mar 15, 1975, 39 y.o.   MRN: [id number]  Psychiatric Assessment Adult  Patient Identification:  Makayla Wilson Date of Evaluation:  10/13/2014 Chief Complaint: "I've been stressed lately History of Chief Complaint:   Chief Complaint  Patient presents with  . Depression  . Anxiety  . Follow-up    Depression        Past medical history includes anxiety.   Anxiety Symptoms include nervous/anxious behavior.     this patient is a 39 year old married white female who lives with her husband and 3 girls ages 1020,18 and 512 in [place]. 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  She states that she's always been a worrying type person. She is the youngest of 3 children and she is always been the most responsible child in the family. Her father took care of everyone in the family but he died 10 years ago and she had to do CPR to try to revive him. She still somewhat blames herself for his death.  The patient states that her mother brother and sister all live very close to her. They depend on her great deal as does her grandmother. Her mother has several health problems  and she won't get medical care. The patient worries a good deal about her kids as well. Her 199 year old has always excelled at cheerleading and is currently at [employer]. She plans to transfer  to [employer] [place] on a cheerleading scholarship. However she told the patient this past weekend that she doesn't want to do this anymore. Her middle daughter is a Advertising account plannerballet dancer and is very serious about it. She recently told her mother that she might be gay.  The patient has been on Lexapro for several years but recently lost the bottle. She states this might of contributed to the "meltdown" she had a few days ago after she found out about the news from both daughters. She was crying and somewhat hysterical which is very unlike her. She's always very controlled and contained. Is very hard for her to say no to anyone in the family and she puts herself last. She's been crying a lot more, nervous and anxious having occasional panic attacks. She denies suicidal ideation. She does not have any psychotic symptoms and does not use drugs or alcohol. She always sleeps 3 or 4 hours per night  The patient returns after 2 months. She is doing better. Her brother-in-law and his kids are finally moved out of her house. This is taking a lot of stress off of her. She's no longer  needing the Xanax but she would like to have the lower dose Klonopin for anxiety as needed. She is sleeping well with the Ambien and her mood has been good. She's no longer having any panic attacks or meltdowns   Review of Systems  Constitutional: Negative.   HENT: Negative.   Eyes: Negative.   Respiratory: Negative.   Cardiovascular: Negative.   Gastrointestinal: Negative.   Endocrine: Negative.   Genitourinary: Negative.   Musculoskeletal: Negative.   Allergic/Immunologic: Negative.   Neurological: Negative.   Hematological: Negative.   Psychiatric/Behavioral: Positive for depression, sleep disturbance and dysphoric mood. The patient is  nervous/anxious.    Physical Exam not done  Depressive Symptoms: depressed mood, anhedonia, feelings of worthlessness/guilt, anxiety, panic attacks, insomnia,  (Hypo) Manic Symptoms:   Elevated Mood:  No Irritable Mood:  Yes Grandiosity:  No Distractibility:  No Labiality of Mood:  No Delusions:  No Hallucinations:  No Impulsivity:  No Sexually Inappropriate Behavior:  No Financial Extravagance:  No Flight of Ideas:  No  Anxiety Symptoms: Excessive Worry:  Yes Panic Symptoms:  Yes Agoraphobia:  No Obsessive Compulsive: No  Symptoms: None, Specific Phobias:  No Social Anxiety:  No  Psychotic Symptoms:  Hallucinations: No None Delusions:  No Paranoia:  No   Ideas of Reference:  No  PTSD Symptoms: Ever had a traumatic exposure:  No Had a traumatic exposure in the last month:  No Re-experiencing: No None Hypervigilance:  No Hyperarousal: No None Avoidance: No None  Traumatic Brain Injury: No   Past Psychiatric History: Diagnosis: Depression   Hospitalizations: None   Outpatient Care: Only through primary care providers   Substance Abuse Care: none  Self-Mutilation: none  Suicidal Attempts: none  Violent Behaviors: none   Past Medical History:   Past Medical History  Diagnosis Date  . Essential hypertension, benign   . Anxiety   . Depression   . GERD (gastroesophageal reflux disease)   . PONV (postoperative nausea and vomiting)    History of Loss of Consciousness:  No Seizure History:  No Cardiac History:  No Allergies:   Allergies  Allergen Reactions  . Morphine And Related Hives  . Neurontin [Gabapentin]   . Other     Glue on Johnson&Johnson bandaids. Caused severe redness and reaction   Current Medications:  Current Outpatient Prescriptions  Medication Sig Dispense Refill  . buPROPion (WELLBUTRIN XL) 150 MG 24 hr tablet Take 1 tablet (150 mg total) by mouth every morning. 30 tablet 2  . escitalopram (LEXAPRO) 20 MG tablet Take 1 tablet  (20 mg total) by mouth daily. 30 tablet 2  . ibuprofen (ADVIL,MOTRIN) 400 MG tablet Take 400 mg by mouth every 6 (six) hours as needed for moderate pain.    Marland Kitchen olmesartan-hydrochlorothiazide (BENICAR HCT) 20-12.5 MG per tablet Take 1 tablet by mouth daily.    . Omega-3 Fatty Acids (FISH OIL) 1000 MG CAPS Take 1,000 mg by mouth daily.    Marland Kitchen omeprazole (PRILOSEC) 20 MG capsule Take 1 capsule (20 mg total) by mouth daily. 30 capsule 0  . zolpidem (AMBIEN) 10 MG tablet Take 1 tablet (10 mg total) by mouth at bedtime as needed for sleep. 30 tablet 2  . ALPRAZolam (XANAX) 1 MG tablet Take 1 tablet (1 mg total) by mouth 4 (four) times daily. (Patient not taking: Reported on 10/13/2014) 120 tablet 2  . clonazePAM (KLONOPIN) 0.5 MG tablet Take 1 tablet (0.5 mg total) by mouth 2 (two) times daily as needed for anxiety. 60 tablet  2   No current facility-administered medications for this visit.    Previous Psychotropic Medications:  Medication Dose   Lexapro   20 mg daily                      Substance Abuse History in the last 12 months: Substance Age of 1st Use Last Use Amount Specific Type  Nicotine      Alcohol      Cannabis      Opiates      Cocaine      Methamphetamines      LSD      Ecstasy      Benzodiazepines      Caffeine      Inhalants      Others:                          Medical Consequences of Substance Abuse: n/a  Legal Consequences of Substance Abuse: n/a  Family Consequences of Substance Abuse: n/a  Blackouts:  No DT's:  No Withdrawal Symptoms:  No None  Social History: Current Place of Residence: [place] [address] of Birth: [place] Family Members: Husband, 3 daughter Marital Status:  Married Children:   Sons:   Daughters: 3 Relationships:  Education:  Corporate treasurer Problems/Performance:  Religious Beliefs/Practices: Christian History of Abuse: none Armed forces technical officer; 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 Legal History: none Hobbies/Interests: Travel, scuba diving  Family History:   Family History  Problem Relation Age of Onset  . CAD Father     Premature  . Anxiety disorder Father   . CAD Mother     Premature  . Depression Other   . Depression Sister     Mental Status Examination/Evaluation: Objective:  Appearance: Casual and Well Groomed  Eye Contact::  Good  Speech:  Clear and Coherent  Volume:  normal  Mood:good  Affect:  Bright   Thought Process:  Goal Directed  Orientation:  Full (Time, Place, and Person)  Thought Content:  WDL  Suicidal Thoughts:  No  Homicidal Thoughts:  No  Judgement:  Good  Insight:  Good  Psychomotor Activity:  Normal  Akathisia:  No  Handed:  Right  AIMS (if indicated):    Assets:  Communication Skills Desire for Improvement Talents/Skills    Laboratory/X-Ray Psychological Evaluation(s)        Assessment:  Axis I: Generalized Anxiety Disorder and Mood Disorder NOS  AXIS I Generalized Anxiety Disorder and Mood Disorder NOS  AXIS II Deferred  AXIS III Past Medical History  Diagnosis Date  . Essential hypertension, benign   . Anxiety   . Depression   . GERD (gastroesophageal reflux disease)   . PONV (postoperative nausea and vomiting)      AXIS IV problems with primary support group  AXIS V 61-70 mild symptoms   Treatment Plan/Recommendations:  Plan of Care: Medication management   Laboratory:    Psychotherapy: She wil into new therapy with Florencia Reasons here   Medications: She will continue Lexapro 20 mg every morning,twice a day and Wellbutrin XL 150 mg every morning her depression and Ambien 10 mg each bedtime for sleep. She'll restart clonazepam 0.5 mg twice a day for anxiety   Routine PRN Medications:  No  Consultations:   Safety Concerns: She denies thoughts of self-harm   Other: She will return in 3 months     Tyrail Grandfield,  MD 10/12/201610:23 AM

## 2014-10-19 ENCOUNTER — Other Ambulatory Visit (HOSPITAL_COMMUNITY): Payer: Self-pay | Admitting: Psychiatry

## 2014-12-22 ENCOUNTER — Other Ambulatory Visit (HOSPITAL_COMMUNITY): Payer: Self-pay | Admitting: Psychiatry

## 2015-01-25 ENCOUNTER — Encounter (HOSPITAL_COMMUNITY): Payer: Self-pay | Admitting: Psychiatry

## 2015-01-25 ENCOUNTER — Ambulatory Visit (INDEPENDENT_AMBULATORY_CARE_PROVIDER_SITE_OTHER): Payer: BLUE CROSS/BLUE SHIELD | Admitting: Psychiatry

## 2015-01-25 VITALS — BP 110/80 | HR 68 | Ht 61.0 in | Wt 178.6 lb

## 2015-01-25 DIAGNOSIS — F411 Generalized anxiety disorder: Secondary | ICD-10-CM | POA: Diagnosis not present

## 2015-01-25 DIAGNOSIS — F32A Depression, unspecified: Secondary | ICD-10-CM

## 2015-01-25 DIAGNOSIS — F329 Major depressive disorder, single episode, unspecified: Secondary | ICD-10-CM

## 2015-01-25 DIAGNOSIS — F39 Unspecified mood [affective] disorder: Secondary | ICD-10-CM | POA: Diagnosis not present

## 2015-01-25 MED ORDER — ZOLPIDEM TARTRATE 10 MG PO TABS: 10.0000 mg | ORAL_TABLET | Freq: Every evening | ORAL | Status: DC | PRN

## 2015-01-25 MED ORDER — ESCITALOPRAM OXALATE 20 MG PO TABS: 20.0000 mg | ORAL_TABLET | Freq: Every day | ORAL | Status: DC

## 2015-01-25 MED ORDER — BUPROPION HCL ER (XL) 150 MG PO TB24: 150.0000 mg | ORAL_TABLET | ORAL | Status: DC

## 2015-01-25 MED ORDER — CLONAZEPAM 0.5 MG PO TABS: 0.5000 mg | ORAL_TABLET | Freq: Three times a day (TID) | ORAL | Status: DC | PRN

## 2015-01-25 NOTE — Progress Notes (Signed)
Patient ID: AMMARIE MATSUURA, female   DOB: [birth year], 40 y.o.   MRN: [id number] Patient ID: XYLAH EARLY, female   DOB: 1975-05-25, 40 y.o.   MRN: [id number] Patient ID: TELSA DILLAVOU, female   DOB: February 11, 1975, 40 y.o.   MRN: [id number] Patient ID: ANASTASIJA ANFINSON, female   DOB: [birth year], 40 y.o.   MRN: [id number] Patient ID: JARITA RAVAL, female   DOB: 1975-12-17, 40 y.o.   MRN: [id number] Patient ID: SORAYA PAQUETTE, female   DOB: [birth year], 40 y.o.   MRN: [id number] Patient ID: SHARMANE DAME, female   DOB: [birth year], 40 y.o.   MRN: [id number] Patient ID: CARLE DARGAN, female   DOB: 1975-06-30, 40 y.o.   MRN: [id number] Patient ID: SHELBEE APGAR, female   DOB: 1975-11-05, 40 y.o.   MRN: [id number] Patient ID: SABREA SANKEY, female   DOB: 18-Jan-1975, 40 y.o.   MRN: [id number] Patient ID: TITA TERHAAR, female   DOB: [birth year], 40 y.o.   MRN: [id number]  Psychiatric Assessment Adult  Patient Identification:  Makayla Wilson Date of Evaluation:  01/25/2015 Chief Complaint: "I've been stressed lately History of Chief Complaint:   Chief Complaint  Patient presents with  . Depression  . Anxiety  . Follow-up    Depression        Past medical history includes anxiety.   Anxiety Symptoms include nervous/anxious behavior.     this patient is a 40 year old married white female who lives with her husband and 3 girls ages 30,18 and 68 in [place]. 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  She states that she's always been a worrying type person. She is the youngest of 3 children and she is always been the most responsible child in the family. Her father took care of everyone in the family but he died 10 years ago and she had to do CPR to try to revive him. She still somewhat blames herself for his death.  The patient states that her mother brother and sister all live very close to her. They depend  on her great deal as does her grandmother. Her mother has several health problems and she won't get medical care. The patient worries a good deal about her kids as well. Her 102 year old has always excelled at cheerleading and is currently at [employer]. She plans to transfer  to [employer] on a cheerleading scholarship. However she told the patient this past weekend that she doesn't want to do this anymore. Her middle daughter is a Advertising account planner and is very serious about it. She recently told her mother that she might be gay.  The patient has been on Lexapro for several years but recently lost the bottle. She states this might of contributed to the "meltdown" she had a few days ago after she found out about the news from both daughters. She was crying and somewhat hysterical which is very unlike her. She's always very controlled and contained. Is very hard for her to say no to anyone in the family and she puts herself last. She's been crying a lot more, nervous and anxious having occasional panic attacks. She denies suicidal ideation. She does not have any psychotic symptoms and does not use drugs or alcohol. She always sleeps 3 or 4 hours per night  The patient returns after 3 months. She has been more stressed lately because both of her grandmothers are very  ill and declining health. She is still working full time. Overall the medications are helping and her husband is quite supportive. She would like one extra pill of clonazepam through the day and I think this would be a good idea   Review of Systems  Constitutional: Negative.   HENT: Negative.   Eyes: Negative.   Respiratory: Negative.   Cardiovascular: Negative.   Gastrointestinal: Negative.   Endocrine: Negative.   Genitourinary: Negative.   Musculoskeletal: Negative.   Allergic/Immunologic: Negative.   Neurological: Negative.   Hematological: Negative.   Psychiatric/Behavioral: Positive for depression, sleep disturbance and  dysphoric mood. The patient is nervous/anxious.    Physical Exam not done  Depressive Symptoms: depressed mood, anhedonia, feelings of worthlessness/guilt, anxiety, panic attacks, insomnia,  (Hypo) Manic Symptoms:   Elevated Mood:  No Irritable Mood:  Yes Grandiosity:  No Distractibility:  No Labiality of Mood:  No Delusions:  No Hallucinations:  No Impulsivity:  No Sexually Inappropriate Behavior:  No Financial Extravagance:  No Flight of Ideas:  No  Anxiety Symptoms: Excessive Worry:  Yes Panic Symptoms:  Yes Agoraphobia:  No Obsessive Compulsive: No  Symptoms: None, Specific Phobias:  No Social Anxiety:  No  Psychotic Symptoms:  Hallucinations: No None Delusions:  No Paranoia:  No   Ideas of Reference:  No  PTSD Symptoms: Ever had a traumatic exposure:  No Had a traumatic exposure in the last month:  No Re-experiencing: No None Hypervigilance:  No Hyperarousal: No None Avoidance: No None  Traumatic Brain Injury: No   Past Psychiatric History: Diagnosis: Depression   Hospitalizations: None   Outpatient Care: Only through primary care providers   Substance Abuse Care: none  Self-Mutilation: none  Suicidal Attempts: none  Violent Behaviors: none   Past Medical History:   Past Medical History  Diagnosis Date  . Essential hypertension, benign   . Anxiety   . Depression   . GERD (gastroesophageal reflux disease)   . PONV (postoperative nausea and vomiting)    History of Loss of Consciousness:  No Seizure History:  No Cardiac History:  No Allergies:   Allergies  Allergen Reactions  . Morphine And Related Hives  . Neurontin [Gabapentin]   . Other     Glue on Johnson&Johnson bandaids. Caused severe redness and reaction   Current Medications:  Current Outpatient Prescriptions  Medication Sig Dispense Refill  . buPROPion (WELLBUTRIN XL) 150 MG 24 hr tablet Take 1 tablet (150 mg total) by mouth every morning. 30 tablet 2  . clonazePAM  (KLONOPIN) 0.5 MG tablet Take 1 tablet (0.5 mg total) by mouth 3 (three) times daily as needed for anxiety. 90 tablet 2  . escitalopram (LEXAPRO) 20 MG tablet Take 1 tablet (20 mg total) by mouth daily. 30 tablet 2  . ibuprofen (ADVIL,MOTRIN) 400 MG tablet Take 400 mg by mouth every 6 (six) hours as needed for moderate pain.    Marland Kitchen lisinopril-hydrochlorothiazide (PRINZIDE,ZESTORETIC) 20-12.5 MG tablet Take 1 tablet by mouth daily.    . Omega-3 Fatty Acids (FISH OIL) 1000 MG CAPS Take 1,000 mg by mouth daily.    Marland Kitchen omeprazole (PRILOSEC) 20 MG capsule Take 1 capsule (20 mg total) by mouth daily. 30 capsule 0  . zolpidem (AMBIEN) 10 MG tablet Take 1 tablet (10 mg total) by mouth at bedtime as needed for sleep. 30 tablet 2   No current facility-administered medications for this visit.    Previous Psychotropic Medications:  Medication Dose   Lexapro   20 mg daily  Substance Abuse History in the last 12 months: Substance Age of 1st Use Last Use Amount Specific Type  Nicotine      Alcohol      Cannabis      Opiates      Cocaine      Methamphetamines      LSD      Ecstasy      Benzodiazepines      Caffeine      Inhalants      Others:                          Medical Consequences of Substance Abuse: n/a  Legal Consequences of Substance Abuse: n/a  Family Consequences of Substance Abuse: n/a  Blackouts:  No DT's:  No Withdrawal Symptoms:  No None  Social History: Current Place of Residence: [place] [address] of Birth: [place] Family Members: Husband, 3 daughter Marital Status:  Married Children:   Sons:   Daughters: 3 Relationships:  Education:  Corporate treasurer Problems/Performance:  Religious Beliefs/Practices: Christian History of Abuse: none Armed forces technical officer; 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 Legal History: none Hobbies/Interests: Travel, scuba diving  Family  History:   Family History  Problem Relation Age of Onset  . CAD Father     Premature  . Anxiety disorder Father   . CAD Mother     Premature  . Depression Other   . Depression Sister     Mental Status Examination/Evaluation: Objective:  Appearance: Casual and Well Groomed  Eye Contact::  Good  Speech:  Clear and Coherent  Volume:  normal  Mood: Fairly good   Affect: Appears stressed   Thought Process:  Goal Directed  Orientation:  Full (Time, Place, and Person)  Thought Content:  WDL  Suicidal Thoughts:  No  Homicidal Thoughts:  No  Judgement:  Good  Insight:  Good  Psychomotor Activity:  Normal  Akathisia:  No  Handed:  Right  AIMS (if indicated):    Assets:  Communication Skills Desire for Improvement Talents/Skills    Laboratory/X-Ray Psychological Evaluation(s)        Assessment:  Axis I: Generalized Anxiety Disorder and Mood Disorder NOS  AXIS I Generalized Anxiety Disorder and Mood Disorder NOS  AXIS II Deferred  AXIS III Past Medical History  Diagnosis Date  . Essential hypertension, benign   . Anxiety   . Depression   . GERD (gastroesophageal reflux disease)   . PONV (postoperative nausea and vomiting)      AXIS IV problems with primary support group  AXIS V 61-70 mild symptoms   Treatment Plan/Recommendations:  Plan of Care: Medication management   Laboratory:    Psychotherapy: She wil into new therapy with Florencia Reasons here   Medications: She will continue Lexapro 20 mg every morning,twice a day and Wellbutrin XL 150 mg every morning for depression and Ambien 10 mg each bedtime for sleep. She'll crease clonazepam 0.5 mg 3 times a day for anxiety   Routine PRN Medications:  No  Consultations:   Safety Concerns: She denies thoughts of self-harm   Other: She will return in 2 months     Diannia Ruder, MD 1/24/20174:21 PM

## 2015-02-01 ENCOUNTER — Telehealth (HOSPITAL_COMMUNITY): Payer: Self-pay | Admitting: *Deleted

## 2015-02-01 NOTE — Telephone Encounter (Signed)
voice message from patient.   She is sleeping only three hours with the Ambien.   She recalls taking the extended release before and it worked.   Wants to know if she can try that.

## 2015-02-02 ENCOUNTER — Telehealth (HOSPITAL_COMMUNITY): Payer: Self-pay | Admitting: *Deleted

## 2015-02-02 NOTE — Telephone Encounter (Signed)
You may call in ambien cr 12.5 mg  #30, 2 refills

## 2015-02-02 NOTE — Telephone Encounter (Signed)
patient says it has been hell with her family,  she is getting little sleep.   The Ambien extended release did help her stay asleep.  she would like to try that again.

## 2015-02-03 ENCOUNTER — Telehealth (HOSPITAL_COMMUNITY): Payer: Self-pay | Admitting: *Deleted

## 2015-02-03 MED ORDER — ZOLPIDEM TARTRATE ER 12.5 MG PO TBCR: 12.5000 mg | EXTENDED_RELEASE_TABLET | Freq: Every evening | ORAL | Status: DC | PRN

## 2015-02-03 NOTE — Telephone Encounter (Signed)
Per Dr. Tenny Craw to call in Ambien 12.5 mg CR Qd in for pt. Pt is aware of changes and she showed understanding. Called pharmacy and spoke with the pharmacist Trip and he stated he will get that ready for pt.

## 2015-02-03 NOTE — Telephone Encounter (Signed)
Pt is aware of new dose amount and shows understanding. Spoke with Trip at pt pharmacy.

## 2015-02-17 ENCOUNTER — Other Ambulatory Visit (HOSPITAL_COMMUNITY): Payer: Self-pay | Admitting: Psychiatry

## 2015-02-17 ENCOUNTER — Telehealth (HOSPITAL_COMMUNITY): Payer: Self-pay | Admitting: *Deleted

## 2015-02-17 MED ORDER — DIAZEPAM 5 MG PO TABS: 5.0000 mg | ORAL_TABLET | Freq: Three times a day (TID) | ORAL | Status: DC | PRN

## 2015-02-17 NOTE — Telephone Encounter (Signed)
Pt called stating she already buried one grandmother and not she is about to bury another grandmother. Per pt, her Klonopin is not working. Per pt she is having really bad chest pains. Per pt, she knows it's anxiety. Asked pt if she seen Peggy recently and she stated no. Pt last saw therapist June 2016. Per pt, she wants this message to go to Dr. Tenny Craw. Pt number is [phone].

## 2015-02-17 NOTE — Telephone Encounter (Signed)
Script for valium printed

## 2015-02-18 ENCOUNTER — Encounter (HOSPITAL_COMMUNITY): Payer: Self-pay | Admitting: *Deleted

## 2015-02-18 NOTE — Progress Notes (Signed)
Pt came into office to pick up printed script for her Valium. Pt D/L number is [id number] with expiration date of 09-20-2013. Pt is aware that D/L given to office has expired and stated husband has her new card. Pt agrees with script.

## 2015-02-18 NOTE — Telephone Encounter (Signed)
Pt is aware printed script is ready for pick up on 02-17-15 and pt came into office on 02-18-15 to pick up printed script

## 2015-03-03 ENCOUNTER — Ambulatory Visit (INDEPENDENT_AMBULATORY_CARE_PROVIDER_SITE_OTHER): Payer: BLUE CROSS/BLUE SHIELD

## 2015-03-03 ENCOUNTER — Encounter: Payer: Self-pay | Admitting: Orthopaedic Surgery

## 2015-03-03 ENCOUNTER — Ambulatory Visit (INDEPENDENT_AMBULATORY_CARE_PROVIDER_SITE_OTHER): Payer: BLUE CROSS/BLUE SHIELD | Admitting: Orthopaedic Surgery

## 2015-03-03 VITALS — BP 120/79 | HR 78 | Temp 98.8°F | Ht 61.0 in | Wt 180.8 lb

## 2015-03-03 DIAGNOSIS — M25512 Pain in left shoulder: Secondary | ICD-10-CM

## 2015-03-03 MED ORDER — HYDROCODONE-ACETAMINOPHEN 7.5-325 MG PO TABS: 1.0000 | ORAL_TABLET | ORAL | Status: DC | PRN

## 2015-03-03 NOTE — Patient Instructions (Signed)
Begin PT. 

## 2015-03-03 NOTE — Progress Notes (Addendum)
Patient ON:GEXBMWU KEYANI RIGDON, female DOB:Nov 21, 1975, 40 y.o. XLK:[id number]  Chief Complaint  Patient presents with  . Shoulder Pain    Left shoulder pain "thinks it happened when doing push ups"    HPI  JOYLENE WESCOTT is a 40 y.o. female who has had pain in the left shoulder since December. She started working out then and did a lot of exercises.  Her left shoulder began hurting and has gotten worse since then. She has no paresthesias.  She has pain laying on it at night.  She has no redness or swelling.  It is not getting better at all despite rest, ice, etc.   Shoulder Pain  The pain is present in the left shoulder. This is a new problem. The current episode started more than 1 month ago. There has been a history of trauma. The problem occurs daily. The problem has been rapidly worsening. The quality of the pain is described as aching and burning. The pain is at a severity of 6/10. The pain is moderate. She has tried acetaminophen, cold, heat, NSAIDS, rest and OTC ointments for the symptoms. The treatment provided mild relief.    Body mass index is 34.18 kg/(m^2).   Review of Systems  Constitutional:       Patient does not have Diabetes Mellitus. Patient has hypertension. Patient does not have COPD or shortness of breath. Patient does not have BMI > 35. Patient does not have current smoking history.  HENT: Negative for congestion.   Respiratory: Negative for cough and shortness of breath.   Cardiovascular: Negative for chest pain.  Gastrointestinal:       GERD  Endocrine: Positive for cold intolerance.  Musculoskeletal: Positive for myalgias and arthralgias.  Allergic/Immunologic: Negative for environmental allergies.  Psychiatric/Behavioral: The patient is nervous/anxious.   All other systems reviewed and are negative.   Past Medical History  Diagnosis Date  . Essential hypertension, benign   . Anxiety   . Depression   . GERD (gastroesophageal reflux disease)   .  PONV (postoperative nausea and vomiting)     Past Surgical History  Procedure Laterality Date  . Cesarean section    . Abdominal hysterectomy    . Reconstruction adbominal wall    . Cyst removal trunk N/A 02/16/2013    Procedure: EXCISION SEBACEOUS CYST ABDOMINAL WALL;  Surgeon: Dalia Heading, MD;  Location: [facility];  Service: General;  Laterality: N/A;    Family History  Problem Relation Age of Onset  . CAD Father     Premature  . Anxiety disorder Father   . CAD Mother     Premature  . Depression Other   . Depression Sister     Social History Social History  Substance Use Topics  . Smoking status: Never Smoker   . Smokeless tobacco: Never Used  . Alcohol Use: Yes     Comment: Occasional    Allergies  Allergen Reactions  . Morphine And Related Hives  . Neurontin [Gabapentin]   . Other     Glue on [employer] bandaids. Caused severe redness and reaction    Current Outpatient Prescriptions  Medication Sig Dispense Refill  . buPROPion (WELLBUTRIN XL) 150 MG 24 hr tablet Take 1 tablet (150 mg total) by mouth every morning. 30 tablet 2  . diazepam (VALIUM) 5 MG tablet Take 1 tablet (5 mg total) by mouth 3 (three) times daily as needed for anxiety. 90 tablet 2  . escitalopram (LEXAPRO) 20 MG tablet Take 1 tablet (20  mg total) by mouth daily. 30 tablet 2  . ibuprofen (ADVIL,MOTRIN) 400 MG tablet Take 400 mg by mouth every 6 (six) hours as needed for moderate pain.    Marland Kitchen lisinopril-hydrochlorothiazide (PRINZIDE,ZESTORETIC) 20-12.5 MG tablet Take 1 tablet by mouth daily.    . Omega-3 Fatty Acids (FISH OIL) 1000 MG CAPS Take 1,000 mg by mouth daily.    Marland Kitchen omeprazole (PRILOSEC) 20 MG capsule Take 1 capsule (20 mg total) by mouth daily. 30 capsule 0  . zolpidem (AMBIEN CR) 12.5 MG CR tablet Take 1 tablet (12.5 mg total) by mouth at bedtime as needed for sleep. 30 tablet 2  . zolpidem (AMBIEN) 10 MG tablet Take 1 tablet (10 mg total) by mouth at bedtime as needed for sleep. 30  tablet 2   No current facility-administered medications for this visit.     Physical Exam  Blood pressure 120/79, pulse 78, temperature 98.8 F (37.1 C), height  (1.549 m), weight 180 lb 12.8 oz (82.01 kg).  Constitutional: overall normal hygiene, normal nutrition, well developed, normal grooming, normal body habitus. Assistive device:none  Musculoskeletal: gait and station Limp none, muscle tone and strength are normal, no tremors or atrophy is present.  .  Neurological: coordination overall normal.  Deep tendon reflex/nerve stretch intact.  Sensation normal.  Cranial nerves II-XII intact.   Skin:   normal overall no scars, lesions, ulcers or rashes. No psoriasis.  Psychiatric: Alert and oriented x 3.  Recent memory intact, remote memory unclear.  Normal mood and affect. Well groomed.  Good eye contact.  Cardiovascular: overall no swelling, no varicosities, no edema bilaterally, normal temperatures of the legs and arms, no clubbing, cyanosis and good capillary refill.  Lymphatic: palpation is normal.  Examination of left Upper Extremity is done.  Inspection:   Overall:  Elbow non-tender without crepitus or defects, forearm non-tender without crepitus or defects, wrist non-tender without crepitus or defects, hand non-tender.    Shoulder: with glenohumeral joint tenderness, without effusion.   Upper arm: without swelling and tenderness   Range of motion:   Overall:  Full range of motion of the elbow, full range of motion of wrist and full range of motion in fingers.   Shoulder:  left  160 degrees forward flexion; 150 degrees abduction; 30 degrees internal rotation, 30 degrees external rotation, 20 degrees extension, 35 degrees adduction.   Stability:   Overall:  Shoulder, elbow and wrist stable   Strength and Tone:   Overall full shoulder muscles strength, full upper arm strength and normal upper arm   The right shoulder has full motion, no pain.  Her neck has full  motion, no pain. The patient request injection, verbal consent was obtained.  The left shoulder was prepped appropriately after time out was performed.   Sterile technique was observed and injection of 1 cc of Depo-Medrol 40 mg with several cc's of plain xylocaine. Anesthesia was provided by ethyl chloride and a 20-gauge needle was used to inject the shoulder area. A posterior approach was used.  The injection was tolerated well.  A band aid dressing was applied.  The patient was advised to apply ice later today and tomorrow to the injection sight as needed.  Her hypertension is well controlled.  She has no distal edema.  She is a little anxious.  I have told her about physical therapy and I will schedule her for this.  X-rays were done, see separate report.  The patient has been educated about the nature of  the problem(s) and counseled on treatment options.  The patient appeared to understand what I have discussed and is in agreement with it.  Encounter Diagnosis  Name Primary?  . Left shoulder pain Yes   PLAN Call if any problems.  Precautions discussed.  Continue current medications.   Return to clinic three weeks

## 2015-03-10 ENCOUNTER — Telehealth: Payer: Self-pay | Admitting: Orthopaedic Surgery

## 2015-03-10 NOTE — Telephone Encounter (Signed)
Patient was seen on March 2nd.  She was to set up with PT.  When she checked out, she stated that she wanted to check around with different PT locations to see who was in her network.  She called back today stating that she wanted to go to [facility] [place].  Would you please send an order to them for this patient.?  Thanks

## 2015-03-10 NOTE — Telephone Encounter (Signed)
Patient states that [employer]

## 2015-03-10 NOTE — Telephone Encounter (Signed)
She must complete the course of the current pain medicine.  I cannot increase it at this time.

## 2015-03-10 NOTE — Telephone Encounter (Signed)
Patient states that the Norco 7.5-325 mgs.is not helping.  She said the injection helped for a few days.  She wants to know if you can give her something else.

## 2015-03-11 ENCOUNTER — Other Ambulatory Visit: Payer: Self-pay | Admitting: *Deleted

## 2015-03-11 DIAGNOSIS — M25512 Pain in left shoulder: Secondary | ICD-10-CM

## 2015-03-11 NOTE — Telephone Encounter (Signed)
Order printed, plz fax and advise patient

## 2015-03-21 ENCOUNTER — Ambulatory Visit (HOSPITAL_COMMUNITY)
Admission: RE | Admit: 2015-03-21 | Discharge: 2015-03-21 | Disposition: A | Discharge status: Home / Self Care | Payer: BLUE CROSS/BLUE SHIELD | Source: Ambulatory Visit | Attending: Registered Nurse | Admitting: Registered Nurse

## 2015-03-21 ENCOUNTER — Other Ambulatory Visit (HOSPITAL_COMMUNITY): Payer: Self-pay | Admitting: Registered Nurse

## 2015-03-21 DIAGNOSIS — M79641 Pain in right hand: Secondary | ICD-10-CM

## 2015-03-24 ENCOUNTER — Encounter: Payer: Self-pay | Admitting: Orthopaedic Surgery

## 2015-03-24 ENCOUNTER — Ambulatory Visit (INDEPENDENT_AMBULATORY_CARE_PROVIDER_SITE_OTHER): Payer: BLUE CROSS/BLUE SHIELD | Admitting: Orthopaedic Surgery

## 2015-03-24 VITALS — BP 124/78 | HR 76 | Temp 98.1°F | Resp 16 | Ht 61.0 in

## 2015-03-24 DIAGNOSIS — M25512 Pain in left shoulder: Secondary | ICD-10-CM | POA: Diagnosis not present

## 2015-03-24 MED ORDER — HYDROCODONE-ACETAMINOPHEN 7.5-325 MG PO TABS: 1.0000 | ORAL_TABLET | ORAL | Status: DC | PRN

## 2015-03-24 NOTE — Progress Notes (Signed)
Patient ZO:XWRUEAV:Makayla Wilson, female DOB:1975-02-21, 40 y.o. [id number]  Chief Complaint  Patient presents with  . Follow-up    follow up right shoulder/right hand    HPI  Makayla Wilson is a 40 y.o. female who has shoulder pain left and also of the right hand.  She hit a wall earlier this week and was seen in the ER. X-rays were negative of the right hand.  She has bruising. She is to go on a trip out of the country this weekend and she is concerned it will interfere with her trip.  Her left shoulder is better.  She has been going to PT/OT.  She is making good progress.  She has pain still but overall doing well. She has no swelling, no paresthesias.  HPI  There is no weight on file to calculate BMI.   Review of Systems  Constitutional:       Patient does not have Diabetes Mellitus. Patient has hypertension. Patient does not have COPD or shortness of breath. Patient does not have BMI > 35. Patient does not have current smoking history.  HENT: Negative for congestion.   Respiratory: Negative for shortness of breath.   Cardiovascular: Negative for chest pain.  Gastrointestinal:       GERD  Endocrine: Positive for cold intolerance.  Musculoskeletal: Positive for myalgias and arthralgias.  Psychiatric/Behavioral: The patient is nervous/anxious.     Past Medical History  Diagnosis Date  . Essential hypertension, benign   . Anxiety   . Depression   . GERD (gastroesophageal reflux disease)   . PONV (postoperative nausea and vomiting)     Past Surgical History  Procedure Laterality Date  . Cesarean section    . Abdominal hysterectomy    . Reconstruction adbominal wall    . Cyst removal trunk N/A 02/16/2013    Procedure: EXCISION SEBACEOUS CYST ABDOMINAL WALL;  Surgeon: Dalia HeadingMark A Jenkins, MD;  Location: [facility];  Service: General;  Laterality: N/A;    Family History  Problem Relation Age of Onset  . CAD Father     Premature  . Anxiety disorder Father   . CAD Mother      Premature  . Depression Other   . Depression Sister     Social History Social History  Substance Use Topics  . Smoking status: Never Smoker   . Smokeless tobacco: Never Used  . Alcohol Use: Yes     Comment: Occasional    Allergies  Allergen Reactions  . Morphine And Related Hives  . Neurontin [Gabapentin]   . Other     Glue on [employer] bandaids. Caused severe redness and reaction    Current Outpatient Prescriptions  Medication Sig Dispense Refill  . buPROPion (WELLBUTRIN XL) 150 MG 24 hr tablet Take 1 tablet (150 mg total) by mouth every morning. 30 tablet 2  . diazepam (VALIUM) 5 MG tablet Take 1 tablet (5 mg total) by mouth 3 (three) times daily as needed for anxiety. 90 tablet 2  . escitalopram (LEXAPRO) 20 MG tablet Take 1 tablet (20 mg total) by mouth daily. 30 tablet 2  . HYDROcodone-acetaminophen (NORCO) 7.5-325 MG tablet Take 1 tablet by mouth every 4 (four) hours as needed for moderate pain (Must last 30 days.  Do not drive or operate machinery while taking this medicine.). 120 tablet 0  . ibuprofen (ADVIL,MOTRIN) 400 MG tablet Take 400 mg by mouth every 6 (six) hours as needed for moderate pain.    Marland Kitchen. lisinopril-hydrochlorothiazide (PRINZIDE,ZESTORETIC) 20-12.5 MG  tablet Take 1 tablet by mouth daily.    . Omega-3 Fatty Acids (FISH OIL) 1000 MG CAPS Take 1,000 mg by mouth daily.    Marland Kitchen omeprazole (PRILOSEC) 20 MG capsule Take 1 capsule (20 mg total) by mouth daily. 30 capsule 0  . zolpidem (AMBIEN CR) 12.5 MG CR tablet Take 1 tablet (12.5 mg total) by mouth at bedtime as needed for sleep. 30 tablet 2  . zolpidem (AMBIEN) 10 MG tablet Take 1 tablet (10 mg total) by mouth at bedtime as needed for sleep. 30 tablet 2   No current facility-administered medications for this visit.     Physical Exam  Blood pressure 124/78, pulse 76, temperature 98.1 F (36.7 C), resp. rate 16, height  (1.549 m).  Constitutional: overall normal hygiene, normal nutrition,  well developed, normal grooming, normal body habitus. Assistive device:none  Musculoskeletal: gait and station Limp none, muscle tone and strength are normal, no tremors or atrophy is present.  .  Neurological: coordination overall normal.  Deep tendon reflex/nerve stretch intact.  Sensation normal.  Cranial nerves II-XII intact.   Skin:   normal overall no scars, lesions, ulcers or rashes. No psoriasis.  Psychiatric: Alert and oriented x 3.  Recent memory intact, remote memory unclear.  Normal mood and affect. Well groomed.  Good eye contact.  Cardiovascular: overall no swelling, no varicosities, no edema bilaterally, normal temperatures of the legs and arms, no clubbing, cyanosis and good capillary refill.  Lymphatic: palpation is normal. Examination of left Upper Extremity is done.  Inspection:   Overall:  Elbow non-tender without crepitus or defects, forearm non-tender without crepitus or defects, wrist non-tender without crepitus or defects, hand non-tender.    Shoulder: with glenohumeral joint tenderness, without effusion.   Upper arm: without swelling and tenderness   Range of motion:   Overall:  Full range of motion of the elbow, full range of motion of wrist and full range of motion in fingers.   Shoulder:  left  180 degrees forward flexion; 160 degrees abduction; 35 degrees internal rotation, 35 degrees external rotation, 30 degrees extension, 40 degrees adduction.   Stability:   Overall:  Shoulder, elbow and wrist stable   Strength and Tone:   Overall full shoulder muscles strength, full upper arm strength and normal upper arm bulk and tone.  The right hand has some dorsal bruising more over the fourth and fifth metacarpals.  I reviewed the x-rays done at the hospital and no fracture is noted.  The patient has been educated about the nature of the problem(s) and counseled on treatment options.  The patient appeared to understand what I have discussed and is in agreement  with it.  Encounter Diagnosis  Name Primary?  . Left shoulder pain Yes    PLAN Call if any problems.  Precautions discussed.  Continue current medications.   Return to clinic 1 month

## 2015-03-25 ENCOUNTER — Ambulatory Visit (HOSPITAL_COMMUNITY): Payer: Self-pay | Admitting: Psychiatry

## 2015-03-28 NOTE — Addendum Note (Signed)
Addended by: Earnstine RegalKEELING, JOHN W on: 03/28/2015 09:42 PM   Modules accepted: Kipp Brood[employer]Set

## 2015-04-19 ENCOUNTER — Encounter (HOSPITAL_COMMUNITY): Payer: Self-pay | Admitting: *Deleted

## 2015-04-19 ENCOUNTER — Emergency Department (HOSPITAL_COMMUNITY)
Admission: EM | Admit: 2015-04-19 | Discharge: 2015-04-19 | Disposition: A | Discharge status: Home / Self Care | Payer: BLUE CROSS/BLUE SHIELD | Attending: Emergency Medicine | Admitting: Emergency Medicine

## 2015-04-19 ENCOUNTER — Emergency Department (HOSPITAL_COMMUNITY): Payer: BLUE CROSS/BLUE SHIELD

## 2015-04-19 DIAGNOSIS — F329 Major depressive disorder, single episode, unspecified: Secondary | ICD-10-CM | POA: Insufficient documentation

## 2015-04-19 DIAGNOSIS — I10 Essential (primary) hypertension: Secondary | ICD-10-CM | POA: Diagnosis not present

## 2015-04-19 DIAGNOSIS — J40 Bronchitis, not specified as acute or chronic: Secondary | ICD-10-CM

## 2015-04-19 DIAGNOSIS — Z79899 Other long term (current) drug therapy: Secondary | ICD-10-CM | POA: Diagnosis not present

## 2015-04-19 DIAGNOSIS — R05 Cough: Secondary | ICD-10-CM | POA: Diagnosis present

## 2015-04-19 MED ORDER — BENZONATATE 100 MG PO CAPS: 100.0000 mg | ORAL_CAPSULE | Freq: Three times a day (TID) | ORAL | Status: DC

## 2015-04-19 MED ORDER — ALBUTEROL SULFATE HFA 108 (90 BASE) MCG/ACT IN AERS: 2.0000 | INHALATION_SPRAY | RESPIRATORY_TRACT | Status: DC | PRN

## 2015-04-19 MED ORDER — METHYLPREDNISOLONE 4 MG PO TBPK: ORAL_TABLET | ORAL | Status: DC

## 2015-04-19 NOTE — Discharge Instructions (Signed)

## 2015-04-19 NOTE — ED Notes (Signed)
Pt with cough for a week, [place]. Denies any recent fever but last time on Sunday. Clear phlegm. Denies night sweats or coughing up blood, denies any weight loss

## 2015-04-19 NOTE — ED Provider Notes (Signed)
CSN: [id number]     Arrival date & time 04/19/15  1246 History   First MD Initiated Contact with Patient 04/19/15 1357     Chief Complaint  Patient presents with  . Cough     (Consider location/radiation/quality/duration/timing/severity/associated sxs/prior Treatment) HPI Comments: The patient is a 40 year old female, she has been recently traveling to [place] where she was scuba diving for a couple of weeks, she has returned home and since returning home has developed nasal drainage, congestion and a dry cough with occasional clear phlegm. She has had fevers up as high as 102, she denies vomiting diarrhea chest pain shortness of breath abdominal pain or any other symptoms. She has not had any medications, her husband had similar symptoms but did not go to the doctor and is now improving. There is no sweating, no lymphadenopathy, no coughing up any blood and no weight loss. She denies any prior lung pathology  Patient is a 40 y.o. female presenting with cough. The history is provided by the patient.  Cough   Past Medical History  Diagnosis Date  . Essential hypertension, benign   . Anxiety   . Depression   . GERD (gastroesophageal reflux disease)   . PONV (postoperative nausea and vomiting)    Past Surgical History  Procedure Laterality Date  . Cesarean section    . Abdominal hysterectomy    . Reconstruction adbominal wall    . Cyst removal trunk N/A 02/16/2013    Procedure: EXCISION SEBACEOUS CYST ABDOMINAL WALL;  Surgeon: Dalia Heading, MD;  Location: [facility];  Service: General;  Laterality: N/A;   Family History  Problem Relation Age of Onset  . CAD Father     Premature  . Anxiety disorder Father   . CAD Mother     Premature  . Depression Other   . Depression Sister    Social History  Substance Use Topics  . Smoking status: Never Smoker   . Smokeless tobacco: Never Used  . Alcohol Use: Yes     Comment: Occasional   OB History    No data available     Review of  Systems  Respiratory: Positive for cough.   All other systems reviewed and are negative.     Allergies  Morphine and related; Neurontin; and Other  Home Medications   Prior to Admission medications   Medication Sig Start Date End Date Taking? Authorizing Provider  buPROPion (WELLBUTRIN XL) 150 MG 24 hr tablet Take 1 tablet (150 mg total) by mouth every morning. 01/25/15 01/25/16  Myrlene Broker, MD  diazepam (VALIUM) 5 MG tablet Take 1 tablet (5 mg total) by mouth 3 (three) times daily as needed for anxiety. 02/17/15 02/17/16  Myrlene Broker, MD  escitalopram (LEXAPRO) 20 MG tablet Take 1 tablet (20 mg total) by mouth daily. 01/25/15 01/25/16  Myrlene Broker, MD  HYDROcodone-acetaminophen (NORCO) 7.5-325 MG tablet Take 1 tablet by mouth every 4 (four) hours as needed for moderate pain (Must last 30 days.  Do not drive or operate machinery while taking this medicine.). 03/24/15   Darreld Mclean, MD  ibuprofen (ADVIL,MOTRIN) 400 MG tablet Take 400 mg by mouth every 6 (six) hours as needed for moderate pain.    Historical Provider, MD  lisinopril-hydrochlorothiazide (PRINZIDE,ZESTORETIC) 20-12.5 MG tablet Take 1 tablet by mouth daily.    Historical Provider, MD  Omega-3 Fatty Acids (FISH OIL) 1000 MG CAPS Take 1,000 mg by mouth daily.    Historical Provider, MD  omeprazole (PRILOSEC)  20 MG capsule Take 1 capsule (20 mg total) by mouth daily. 01/08/13   Gerhard Munchobert Lockwood, MD  zolpidem (AMBIEN CR) 12.5 MG CR tablet Take 1 tablet (12.5 mg total) by mouth at bedtime as needed for sleep. 02/03/15   Myrlene Brokereborah R Ross, MD  zolpidem (AMBIEN) 10 MG tablet Take 1 tablet (10 mg total) by mouth at bedtime as needed for sleep. 01/25/15 05/23/15  Myrlene Brokereborah R Ross, MD   BP 138/80 mmHg  Pulse 66  Temp(Src) 98 F (36.7 C) (Oral)  Resp 18  Ht 5\' 1"  (1.549 m)  Wt 175 lb (79.379 kg)  BMI 33.08 kg/m2  SpO2 100% Physical Exam  Constitutional: She appears well-developed and well-nourished. No distress.  HENT:  Head:  Normocephalic and atraumatic.  Mouth/Throat: Oropharynx is clear and moist. No oropharyngeal exudate.  Oropharynx is clear and moist, no exudate asymmetry hypertrophy or erythema, normal phonation  Eyes: Conjunctivae and EOM are normal. Pupils are equal, round, and reactive to light. Right eye exhibits no discharge. Left eye exhibits no discharge. No scleral icterus.  Neck: Normal range of motion. Neck supple. No JVD present. No thyromegaly present.  Very supple neck without lymphadenopathy  Cardiovascular: Normal rate, regular rhythm, normal heart sounds and intact distal pulses.  Exam reveals no gallop and no friction rub.   No murmur heard. Pulmonary/Chest: Effort normal and breath sounds normal. No respiratory distress. She has no wheezes. She has no rales.  Occasional dry cough, speaks in full sentences without distress or accessory muscle use or increased work of breathing, no wheezes rales or rhonchi  Abdominal: Soft. Bowel sounds are normal. She exhibits no distension and no mass. There is no tenderness.  Musculoskeletal: Normal range of motion. She exhibits no edema or tenderness.  Lymphadenopathy:    She has no cervical adenopathy.  Neurological: She is alert. Coordination normal.  Skin: Skin is warm and dry. No rash noted. No erythema.  Psychiatric: She has a normal mood and affect. Her behavior is normal.  Nursing note and vitals reviewed.   ED Course  Procedures (including critical care time) Labs Review Labs Reviewed - No data to display  Imaging Review Dg Chest 2 View  04/19/2015  CLINICAL DATA:  One week history of cough. EXAM: CHEST  2 VIEW COMPARISON:  01/08/2013. FINDINGS: The lungs are clear wiithout focal pneumonia, edema, pneumothorax or pleural effusion. The cardiopericardial silhouette is within normal limits for size. The visualized bony structures of the thorax are intact. IMPRESSION: Normal exam. Electronically Signed   By: Kennith CenterEric  Mansell M.D.   On: 04/19/2015  13:22   I have personally reviewed and evaluated these images and lab results as part of my medical decision-making.    MDM   Final diagnoses:  Bronchitis    The patient is well-appearing, vital signs are unremarkable, chest x-ray is also unremarkable as ordered by nursing, we'll treat clinically for bronchitis with Tessalon, Medrol, albuterol, the patient is in total agreement and will follow up as needed. I have encouraged her to follow-up closely for TB testing with her doctor  Meds given in ED:  Medications - No data to display  New Prescriptions   ALBUTEROL (PROVENTIL HFA;VENTOLIN HFA) 108 (90 BASE) MCG/ACT INHALER    Inhale 2 puffs into the lungs every 4 (four) hours as needed for wheezing or shortness of breath.   BENZONATATE (TESSALON) 100 MG CAPSULE    Take 1 capsule (100 mg total) by mouth every 8 (eight) hours.   METHYLPREDNISOLONE (MEDROL DOSEPAK)  4 MG TBPK TABLET    Taper over 6 days        Eber Hong, MD 04/19/15 1406

## 2015-04-21 ENCOUNTER — Ambulatory Visit (INDEPENDENT_AMBULATORY_CARE_PROVIDER_SITE_OTHER): Payer: BLUE CROSS/BLUE SHIELD | Admitting: Orthopaedic Surgery

## 2015-04-21 ENCOUNTER — Encounter: Payer: Self-pay | Admitting: Orthopaedic Surgery

## 2015-04-21 VITALS — BP 124/79 | HR 74 | Temp 98.1°F | Ht 61.0 in | Wt 175.0 lb

## 2015-04-21 DIAGNOSIS — M25512 Pain in left shoulder: Secondary | ICD-10-CM

## 2015-04-21 MED ORDER — HYDROCODONE-ACETAMINOPHEN 7.5-325 MG PO TABS: 1.0000 | ORAL_TABLET | ORAL | Status: DC | PRN
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
## 2015-04-23 ENCOUNTER — Other Ambulatory Visit (HOSPITAL_COMMUNITY): Payer: Self-pay | Admitting: Psychiatry

## 2015-04-26 ENCOUNTER — Ambulatory Visit (INDEPENDENT_AMBULATORY_CARE_PROVIDER_SITE_OTHER): Payer: BLUE CROSS/BLUE SHIELD | Admitting: Psychiatry

## 2015-04-26 ENCOUNTER — Encounter (HOSPITAL_COMMUNITY): Payer: Self-pay | Admitting: Psychiatry

## 2015-04-26 VITALS — BP 127/80 | HR 83 | Ht 61.0 in | Wt 176.0 lb

## 2015-04-26 DIAGNOSIS — F39 Unspecified mood [affective] disorder: Secondary | ICD-10-CM | POA: Diagnosis not present

## 2015-04-26 DIAGNOSIS — F411 Generalized anxiety disorder: Secondary | ICD-10-CM

## 2015-04-26 DIAGNOSIS — F32A Depression, unspecified: Secondary | ICD-10-CM

## 2015-04-26 DIAGNOSIS — F329 Major depressive disorder, single episode, unspecified: Secondary | ICD-10-CM

## 2015-04-26 MED ORDER — CLONAZEPAM 1 MG PO TABS: 1.0000 mg | ORAL_TABLET | Freq: Three times a day (TID) | ORAL | Status: DC

## 2015-04-26 MED ORDER — BUPROPION HCL ER (XL) 150 MG PO TB24: 150.0000 mg | ORAL_TABLET | ORAL | Status: DC

## 2015-04-26 MED ORDER — ZOLPIDEM TARTRATE ER 12.5 MG PO TBCR: 12.5000 mg | EXTENDED_RELEASE_TABLET | Freq: Every evening | ORAL | Status: DC | PRN

## 2015-04-26 MED ORDER — ESCITALOPRAM OXALATE 20 MG PO TABS: 20.0000 mg | ORAL_TABLET | Freq: Every day | ORAL | Status: DC
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
## 2015-06-02 ENCOUNTER — Ambulatory Visit: Payer: Self-pay | Admitting: Orthopaedic Surgery

## 2015-06-02 ENCOUNTER — Encounter: Payer: Self-pay | Admitting: Orthopaedic Surgery

## 2015-06-08 ENCOUNTER — Telehealth: Payer: Self-pay | Admitting: Orthopaedic Surgery

## 2015-06-08 NOTE — Telephone Encounter (Signed)
Patient called to relay that she is aware she missed her 06/02/15 for re-check of shoulder.  She states shoulder has been fine, and states that she has had a new injury - a fall from a horse, which occurred about 2 weeks ago, and said that she has pain around tailbone area.  States she has not yet had treatment, but may go on to Urgent care or Emergency room.  Appointment has also been scheduled in our office, first available.

## 2015-06-14 ENCOUNTER — Encounter: Payer: Self-pay | Admitting: Orthopaedic Surgery

## 2015-06-14 ENCOUNTER — Ambulatory Visit: Payer: BLUE CROSS/BLUE SHIELD | Admitting: Orthopaedic Surgery

## 2015-07-26 ENCOUNTER — Encounter (HOSPITAL_COMMUNITY): Payer: Self-pay | Admitting: *Deleted

## 2015-07-26 ENCOUNTER — Ambulatory Visit (HOSPITAL_COMMUNITY): Payer: Self-pay | Admitting: Psychiatry

## 2015-08-17 ENCOUNTER — Other Ambulatory Visit (HOSPITAL_COMMUNITY): Payer: Self-pay | Admitting: Psychiatry

## 2015-08-25 ENCOUNTER — Other Ambulatory Visit (HOSPITAL_COMMUNITY): Payer: Self-pay | Admitting: Psychiatry

## 2015-08-30 ENCOUNTER — Telehealth (HOSPITAL_COMMUNITY): Payer: Self-pay | Admitting: *Deleted

## 2015-08-30 NOTE — Telephone Encounter (Signed)
noted 

## 2015-08-30 NOTE — Telephone Encounter (Signed)
Refills only at appt

## 2015-08-30 NOTE — Telephone Encounter (Signed)
Pt pharmacy requesting refill for pt Zolpidem CR via e-scribe. Per pt chart, pt medication last filled on 04-26-15 with 30 tab 2 refills. Per pt chart, pt no showed for her f/u on 09-07-2014, 03-25-2015 and came for f/u on 04-26-2015. Pt was instructed to return on 07-26-2015 and no showed. Pt is scheduled for f/u for 09-15-2015.

## 2015-09-15 ENCOUNTER — Ambulatory Visit (HOSPITAL_COMMUNITY): Payer: Self-pay | Admitting: Psychiatry

## 2015-09-15 ENCOUNTER — Telehealth (HOSPITAL_COMMUNITY): Payer: Self-pay | Admitting: *Deleted

## 2015-09-15 NOTE — Telephone Encounter (Signed)
Called pt at 7:08, 7:14 and 7:23 to resch appt for 09-15-15 to 09-16-15 due to provider not being in office.../or

## 2016-02-09 ENCOUNTER — Emergency Department (HOSPITAL_COMMUNITY)
Admission: EM | Admit: 2016-02-09 | Discharge: 2016-02-09 | Disposition: A | Discharge status: Home / Self Care | Payer: 59 | Attending: Emergency Medicine | Admitting: Emergency Medicine

## 2016-02-09 ENCOUNTER — Emergency Department (HOSPITAL_COMMUNITY): Payer: 59

## 2016-02-09 ENCOUNTER — Encounter (HOSPITAL_COMMUNITY): Payer: Self-pay | Admitting: *Deleted

## 2016-02-09 DIAGNOSIS — I10 Essential (primary) hypertension: Secondary | ICD-10-CM | POA: Diagnosis not present

## 2016-02-09 DIAGNOSIS — R1032 Left lower quadrant pain: Secondary | ICD-10-CM | POA: Diagnosis not present

## 2016-02-09 DIAGNOSIS — R110 Nausea: Secondary | ICD-10-CM | POA: Insufficient documentation

## 2016-02-09 DIAGNOSIS — Z79899 Other long term (current) drug therapy: Secondary | ICD-10-CM | POA: Diagnosis not present

## 2016-02-09 DIAGNOSIS — R102 Pelvic and perineal pain: Secondary | ICD-10-CM | POA: Diagnosis present

## 2016-02-09 DIAGNOSIS — R10A0 Flank pain, unspecified side: Secondary | ICD-10-CM

## 2016-02-09 DIAGNOSIS — R109 Unspecified abdominal pain: Secondary | ICD-10-CM

## 2016-02-09 HISTORY — DX: Disorder of thyroid, unspecified: E07.9

## 2016-02-09 LAB — URINALYSIS, ROUTINE W REFLEX MICROSCOPIC
Bilirubin Urine: NEGATIVE
Glucose, UA: NEGATIVE mg/dL
Hgb urine dipstick: NEGATIVE
KETONES UR: NEGATIVE mg/dL
LEUKOCYTES UA: NEGATIVE
NITRITE: NEGATIVE
PH: 7 (ref 5.0–8.0)
Protein, ur: NEGATIVE mg/dL
Specific Gravity, Urine: 1.008 (ref 1.005–1.030)

## 2016-02-09 LAB — CBC WITH DIFFERENTIAL/PLATELET
BASOS PCT: 1 %
Basophils Absolute: 0 10*3/uL (ref 0.0–0.1)
Eosinophils Absolute: 0.1 10*3/uL (ref 0.0–0.7)
Eosinophils Relative: 1 %
HEMATOCRIT: 38.8 % (ref 36.0–46.0)
HEMOGLOBIN: 13.5 g/dL (ref 12.0–15.0)
LYMPHS PCT: 35 %
Lymphs Abs: 3.1 10*3/uL (ref 0.7–4.0)
MCH: 29.9 pg (ref 26.0–34.0)
MCHC: 34.8 g/dL (ref 30.0–36.0)
MCV: 85.8 fL (ref 78.0–100.0)
MONOS PCT: 5 %
Monocytes Absolute: 0.4 10*3/uL (ref 0.1–1.0)
NEUTROS ABS: 5.2 10*3/uL (ref 1.7–7.7)
NEUTROS PCT: 58 %
Platelets: 256 10*3/uL (ref 150–400)
RBC: 4.52 MIL/uL (ref 3.87–5.11)
RDW: 13 % (ref 11.5–15.5)
WBC: 8.8 10*3/uL (ref 4.0–10.5)

## 2016-02-09 LAB — COMPREHENSIVE METABOLIC PANEL
ALBUMIN: 4.4 g/dL (ref 3.5–5.0)
ALK PHOS: 55 U/L (ref 38–126)
ALT: 23 U/L (ref 14–54)
AST: 20 U/L (ref 15–41)
Anion gap: 13 (ref 5–15)
BILIRUBIN TOTAL: 0.7 mg/dL (ref 0.3–1.2)
BUN: 6 mg/dL (ref 6–20)
CALCIUM: 9.2 mg/dL (ref 8.9–10.3)
CO2: 23 mmol/L (ref 22–32)
Chloride: 97 mmol/L — ABNORMAL LOW (ref 101–111)
Creatinine, Ser: 0.66 mg/dL (ref 0.44–1.00)
GFR calc Af Amer: >60 mL/min (ref >60)
GFR calc non Af Amer: >60 mL/min (ref >60)
GLUCOSE: 80 mg/dL (ref 65–99)
Potassium: 3 mmol/L — ABNORMAL LOW (ref 3.5–5.1)
Sodium: 133 mmol/L — ABNORMAL LOW (ref 135–145)
TOTAL PROTEIN: 7.3 g/dL (ref 6.5–8.1)

## 2016-02-09 LAB — PREGNANCY, URINE: Preg Test, Ur: NEGATIVE

## 2016-02-09 LAB — HCG, QUANTITATIVE, PREGNANCY: hCG, Beta Chain, Quant, S: <1 m[IU]/mL (ref <5)

## 2016-02-09 LAB — LIPASE, BLOOD: Lipase: 18 U/L (ref 11–51)

## 2016-02-09 MED ORDER — POTASSIUM CHLORIDE CRYS ER 20 MEQ PO TBCR
40.0000 meq | EXTENDED_RELEASE_TABLET | Freq: Once | ORAL | Status: AC
  Administered 2016-02-09: 40 meq via ORAL
  Filled 2016-02-09: qty 2

## 2016-02-09 MED ORDER — IBUPROFEN 800 MG PO TABS: 800.0000 mg | ORAL_TABLET | Freq: Three times a day (TID) | ORAL | 0 refills | Status: AC

## 2016-02-09 MED ORDER — HYDROMORPHONE HCL 1 MG/ML IJ SOLN
1.0000 mg | Freq: Once | INTRAMUSCULAR | Status: AC
  Administered 2016-02-09: 1 mg via INTRAVENOUS
  Filled 2016-02-09: qty 1

## 2016-02-09 MED ORDER — TAMSULOSIN HCL 0.4 MG PO CAPS: 0.4000 mg | ORAL_CAPSULE | Freq: Every day | ORAL | 0 refills | Status: DC

## 2016-02-09 MED ORDER — SODIUM CHLORIDE 0.9 % IV BOLUS (SEPSIS)
1000.0000 mL | Freq: Once | INTRAVENOUS | Status: AC
  Administered 2016-02-09: 1000 mL via INTRAVENOUS

## 2016-02-09 MED ORDER — ONDANSETRON HCL 4 MG/2ML IJ SOLN
4.0000 mg | Freq: Once | INTRAMUSCULAR | Status: AC
  Administered 2016-02-09: 4 mg via INTRAVENOUS
  Filled 2016-02-09: qty 2

## 2016-02-09 MED ORDER — ONDANSETRON HCL 4 MG PO TABS: 4.0000 mg | ORAL_TABLET | Freq: Three times a day (TID) | ORAL | 0 refills | Status: DC | PRN

## 2016-02-09 MED ORDER — HYDROCODONE-ACETAMINOPHEN 5-325 MG PO TABS: 1.0000 | ORAL_TABLET | Freq: Four times a day (QID) | ORAL | 0 refills | Status: DC | PRN

## 2016-02-09 NOTE — ED Triage Notes (Signed)
Pt c/o left flank pain that wraps around to the abdomen that started 2 days ago. Pt also c/o nausea, denies vomiting. Denies fever. Pt reports the pain feels the same as when she had diverticulitis in the past. Pt denies urinary symptoms.

## 2016-02-09 NOTE — ED Provider Notes (Signed)
[facility] Provider Note   CSN: [id number] Arrival date & time: 02/09/16  1541     History   Chief Complaint Chief Complaint  Patient presents with  . Flank Pain    HPI Makayla Wilson is a 41 y.o. female.  41 year old female with an acute onset of left-sided flank pain that was dull in nature approximately 2 days ago. Progressively worsened to the point where became more sharp and radiated around down to her left lower pelvic area and suprapubic area. No urinary changes. Has had some nausea with the waves of pain but no diarrhea, vomiting or constipation. She felt hot at home but did not check her temperature. No other GI symptoms. States it feels similar to diverticulitis in the past.      Past Medical History:  Diagnosis Date  . Anxiety   . Depression   . Essential hypertension, benign   . GERD (gastroesophageal reflux disease)   . PONV (postoperative nausea and vomiting)   . Thyroid disease     Patient Active Problem List   Diagnosis Date Noted  . Precordial pain 02/06/2013  . Depression 02/05/2013  . Essential hypertension, benign 02/05/2013  . GERD (gastroesophageal reflux disease) 02/05/2013    Past Surgical History:  Procedure Laterality Date  . ABDOMINAL HYSTERECTOMY    . CESAREAN SECTION    . CYST REMOVAL TRUNK N/A 02/16/2013   Procedure: EXCISION SEBACEOUS CYST ABDOMINAL WALL;  Surgeon: Dalia HeadingMark A Jenkins, MD;  Location: [facility];  Service: General;  Laterality: N/A;  . RECONSTRUCTION ADBOMINAL WALL      OB History    No data available       Home Medications    Prior to Admission medications   Medication Sig Start Date End Date Taking? Authorizing Provider  levothyroxine (SYNTHROID, LEVOTHROID) 50 MCG tablet Take 50 mcg by mouth daily before breakfast.   Yes Historical Provider, MD  HYDROcodone-acetaminophen (NORCO) 5-325 MG tablet Take 1-2 tablets by mouth every 6 (six) hours as needed for severe pain. 02/09/16   Marily MemosJason Doreatha Offer, MD  ibuprofen  (ADVIL,MOTRIN) 800 MG tablet Take 1 tablet (800 mg total) by mouth 3 (three) times daily. 02/09/16   Marily MemosJason Naod Sweetland, MD  ondansetron (ZOFRAN) 4 MG tablet Take 1 tablet (4 mg total) by mouth every 8 (eight) hours as needed for nausea or vomiting. 02/09/16   Marily MemosJason Breah Joa, MD  tamsulosin (FLOMAX) 0.4 MG CAPS capsule Take 1 capsule (0.4 mg total) by mouth daily. 02/09/16   Marily MemosJason Lasonya Hubner, MD    Family History Family History  Problem Relation Age of Onset  . CAD Father     Premature  . Anxiety disorder Father   . CAD Mother     Premature  . Depression Other   . Depression Sister     Social History Social History  Substance Use Topics  . Smoking status: Never Smoker  . Smokeless tobacco: Never Used  . Alcohol use Yes     Comment: Occasional     Allergies   Morphine and related; Neurontin [gabapentin]; and Other   Review of Systems Review of Systems  All other systems reviewed and are negative.    Physical Exam Updated Vital Signs BP 132/92 (BP Location: Right Arm)   Pulse 69   Temp 98.5 F (36.9 C) (Oral)   Resp 18   Ht 5\' 1"  (1.549 m)   Wt 179 lb (81.2 kg)   SpO2 100%   BMI 33.82 kg/m   Physical Exam  Constitutional: She  is oriented to person, place, and time. She appears well-developed and well-nourished.  HENT:  Head: Normocephalic and atraumatic.  Eyes: Conjunctivae and EOM are normal.  Neck: Normal range of motion.  Cardiovascular: Normal rate and regular rhythm.   Pulmonary/Chest: No stridor. No respiratory distress.  Abdominal: Soft. She exhibits no distension and no mass. There is tenderness (suprapubic, left lower quadrant). There is no rebound and no guarding.  Musculoskeletal: Normal range of motion. She exhibits tenderness (left cva/flank).  Neurological: She is alert and oriented to person, place, and time. No cranial nerve deficit.  Nursing note and vitals reviewed.    ED Treatments / Results  Labs (all labs ordered are listed, but only abnormal  results are displayed) Labs Reviewed  URINALYSIS, ROUTINE W REFLEX MICROSCOPIC - Abnormal; Notable for the following:       Result Value   APPearance CLOUDY (*)    All other components within normal limits  COMPREHENSIVE METABOLIC PANEL - Abnormal; Notable for the following:    Sodium 133 (*)    Potassium 3.0 (*)    Chloride 97 (*)    All other components within normal limits  CBC WITH DIFFERENTIAL/PLATELET  LIPASE, BLOOD  HCG, QUANTITATIVE, PREGNANCY  PREGNANCY, URINE    EKG  EKG Interpretation None       Radiology Ct Renal Stone Study  Result Date: 02/09/2016 CLINICAL DATA:  LEFT flank pain which began 2 days prior. EXAM: CT ABDOMEN AND PELVIS WITHOUT CONTRAST TECHNIQUE: Multidetector CT imaging of the abdomen and pelvis was performed following the standard protocol without IV contrast. COMPARISON:  CT 08/26/2014 FINDINGS: Lower chest: Lung bases are clear. Hepatobiliary: No focal hepatic lesion. No biliary duct dilatation. Gallbladder is normal. Common bile duct is normal. Pancreas: Pancreas is normal. No ductal dilatation. No pancreatic inflammation. Spleen: Normal spleen Adrenals/urinary tract: Normal adrenal glands. No nephrolithiasis ureterolithiasis. No bladder calculi. No obstructive uropathy. Stomach/Bowel: Stomach, small bowel, appendix, and cecum are normal. The colon and rectosigmoid colon are normal. Vascular/Lymphatic: Abdominal aorta is normal caliber. There is no retroperitoneal or periportal lymphadenopathy. No pelvic lymphadenopathy. Reproductive: Post hysterectomy.  No adnexal abnormality Other: No free fluid.  No inguinal hernia or ventral hernia. Musculoskeletal: No aggressive osseous lesion. IMPRESSION: 1. No nephrolithiasis or ureterolithiasis. 2. No bladder calculi. 3. No explanation for LEFT flank pain. Electronically Signed   By: Genevive Bi M.D.   On: 02/09/2016 19:16    Procedures Procedures (including critical care time)  Angiocath  insertion Performed by: Marily Memos  Consent: Verbal consent obtained. Risks and benefits: risks, benefits and alternatives were discussed Time out: Immediately prior to procedure a "time out" was called to verify the correct patient, procedure, equipment, support staff and site/side marked as required.  Preparation: Patient was prepped and draped in the usual sterile fashion.  Vein Location: left AC  Ultrasound Guided  Gauge: 20  Normal blood return and flush without difficulty Patient tolerance: Patient tolerated the procedure well with no immediate complications.     Medications Ordered in ED Medications  sodium chloride 0.9 % bolus 1,000 mL (0 mLs Intravenous Stopped 02/09/16 1930)  HYDROmorphone (DILAUDID) injection 1 mg (1 mg Intravenous Given 02/09/16 1825)  ondansetron (ZOFRAN) injection 4 mg (4 mg Intravenous Given 02/09/16 1825)  potassium chloride SA (K-DUR,KLOR-CON) CR tablet 40 mEq (40 mEq Oral Given 02/09/16 2103)     Initial Impression / Assessment and Plan / ED Course  I have reviewed the triage vital signs and the nursing notes.  Pertinent labs &  imaging results that were available during my care of the patient were reviewed by me and considered in my medical decision making (see chart for details).     Based on history and exam feels more consistent with possible nephrolithiasis rather than diverticulitis. I will treat her pain and give her fluids and nausea medicine and get a CT scan with labs.  CT on my read appears to have a kidney stone, radiology doesn't agree. I will treat as such. Patient improved here. Will fu urology prn if not improving. otherwise stable for outpatietn treatment.   Final Clinical Impressions(s) / ED Diagnoses   Final diagnoses:  Flank pain    New Prescriptions Discharge Medication List as of 02/09/2016  8:47 PM    START taking these medications   Details  HYDROcodone-acetaminophen (NORCO) 5-325 MG tablet Take 1-2 tablets by  mouth every 6 (six) hours as needed for severe pain., Starting Thu 02/09/2016, Print    ondansetron (ZOFRAN) 4 MG tablet Take 1 tablet (4 mg total) by mouth every 8 (eight) hours as needed for nausea or vomiting., Starting Thu 02/09/2016, Print    tamsulosin (FLOMAX) 0.4 MG CAPS capsule Take 1 capsule (0.4 mg total) by mouth daily., Starting Thu 02/09/2016, Print         Marily Memos, MD 02/09/16 [id number]

## 2016-02-09 NOTE — ED Notes (Signed)
ED Provider at bedside. 

## 2016-02-09 NOTE — ED Notes (Signed)
MD at bedside to attempt US IV.  

## 2016-02-09 NOTE — ED Notes (Signed)
Received report on pt, pt denies any complaints, ambulatory to restroom, tolerated well, update given,

## 2016-02-09 NOTE — ED Notes (Signed)
This nurse unable to get IV started. Attempts made in Left and right ac.    Charge nurse using US to get IV established.

## 2017-04-24 IMAGING — CT CT RENAL STONE PROTOCOL
2 of 4 series · 16 of 46 positions shown, 18 images · non-contrast
Comparison: CT 08/26/2014

CLINICAL DATA: LEFT flank pain which began 2 days prior.

EXAM:
CT ABDOMEN AND PELVIS WITHOUT CONTRAST
TECHNIQUE: Multidetector CT imaging of the abdomen and pelvis was performed
following the standard protocol without IV contrast.

[Series 2: axial st · axial · 0.96mm/px · z∈[-404,-4]mm · 13 of 88 slices shown, 15 images]
[im 4/88  soft-tissue]
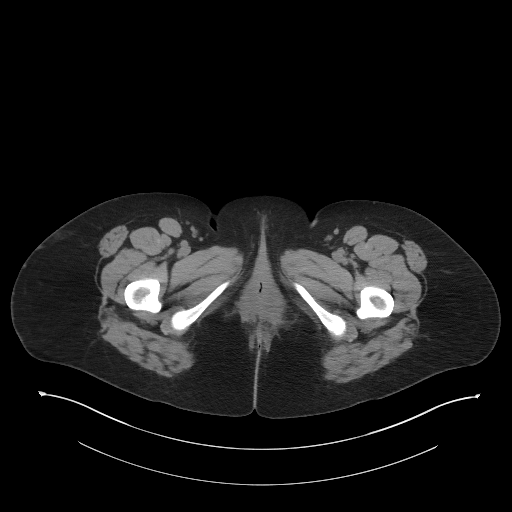
[im 4/88  bone]
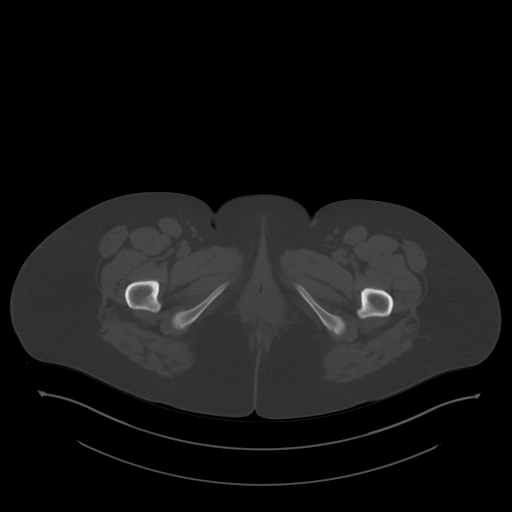
[im 11/88  soft-tissue]
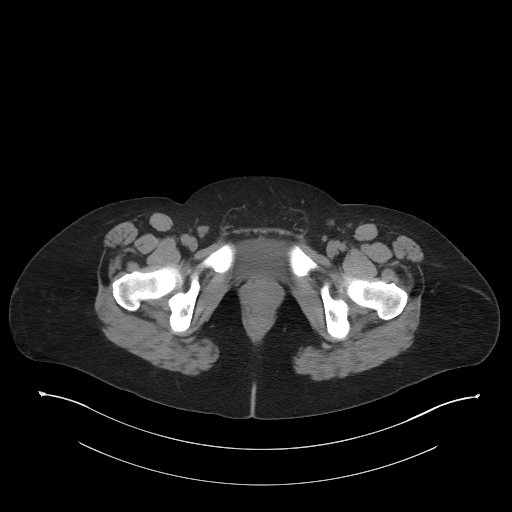
[im 17/88  soft-tissue]
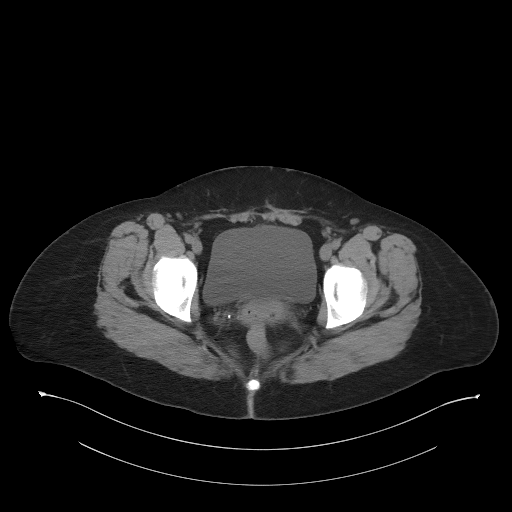
[im 24/88  soft-tissue]
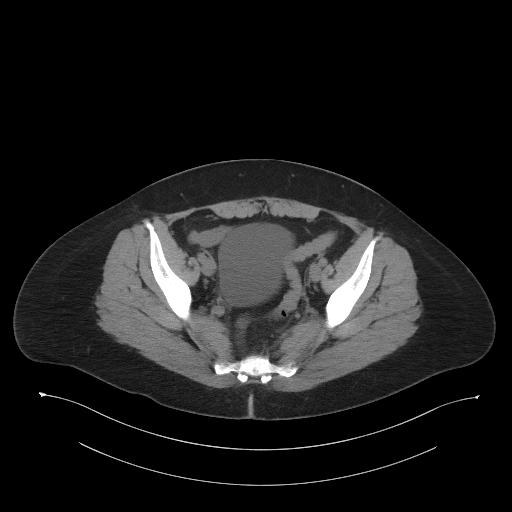
[im 31/88  soft-tissue]
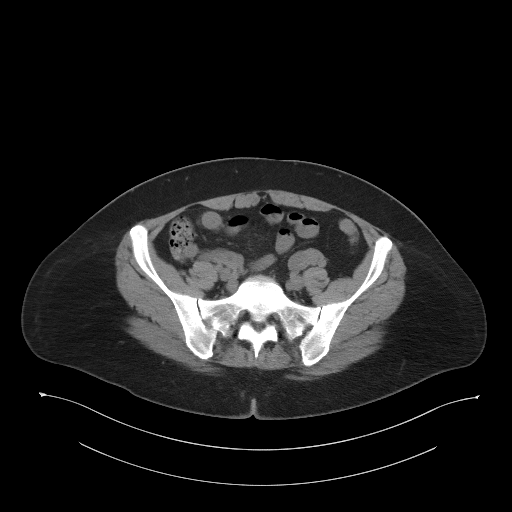
[im 37/88  soft-tissue]
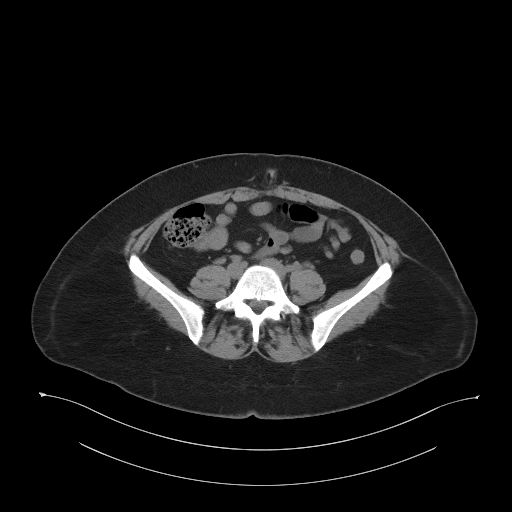
[im 44/88  soft-tissue]
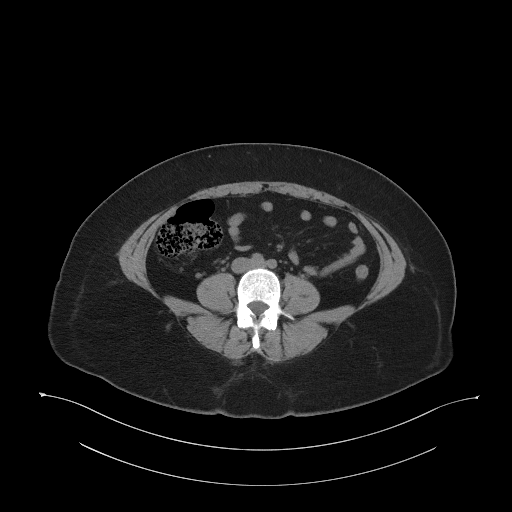
[im 51/88  soft-tissue]
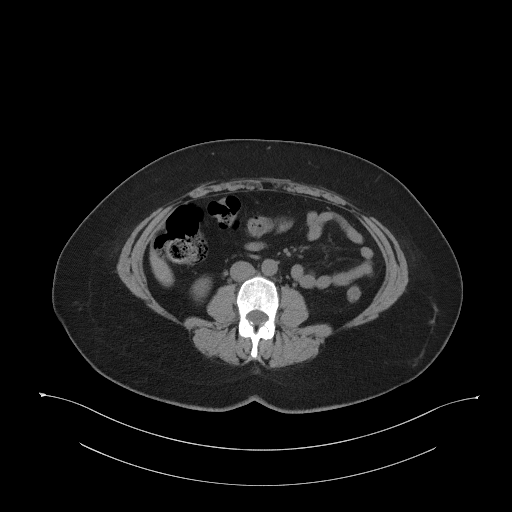
[im 57/88  soft-tissue]
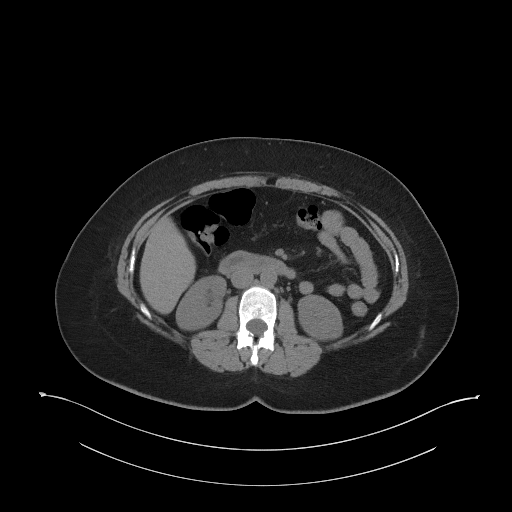
[im 57/88  bone]
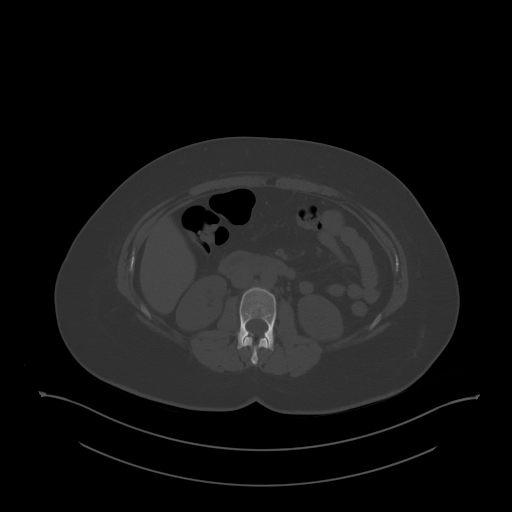
[im 64/88  soft-tissue]
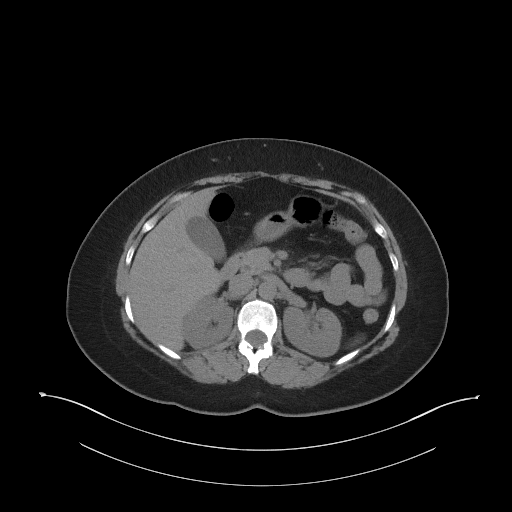
[im 71/88  soft-tissue]
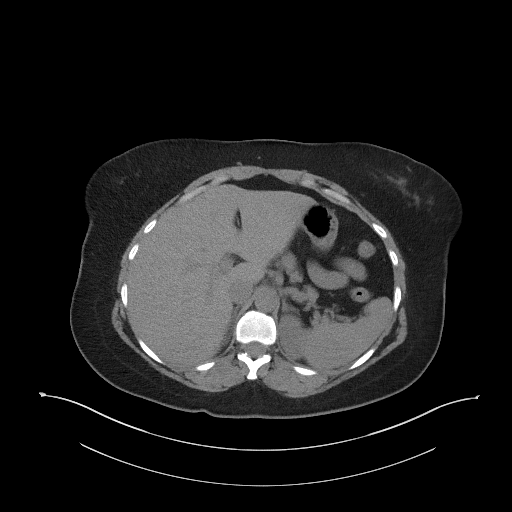
[im 77/88  soft-tissue]
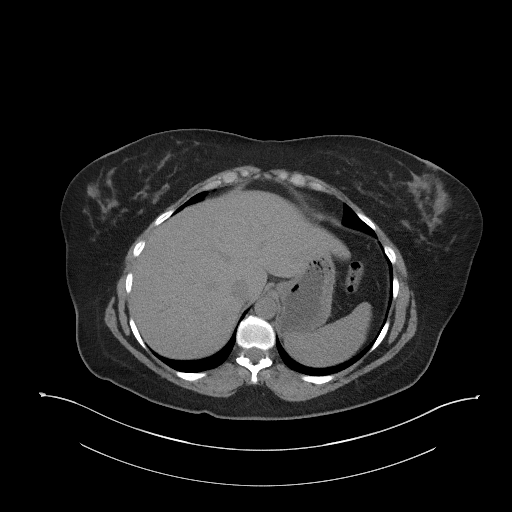
[im 84/88  soft-tissue]
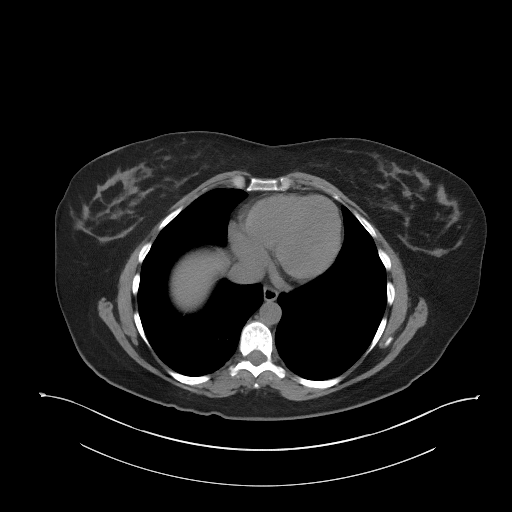

[Series 4: coronal st · coronal · 0.70mm/px · 3 of 81 slices shown]
[im 27/81  soft-tissue]
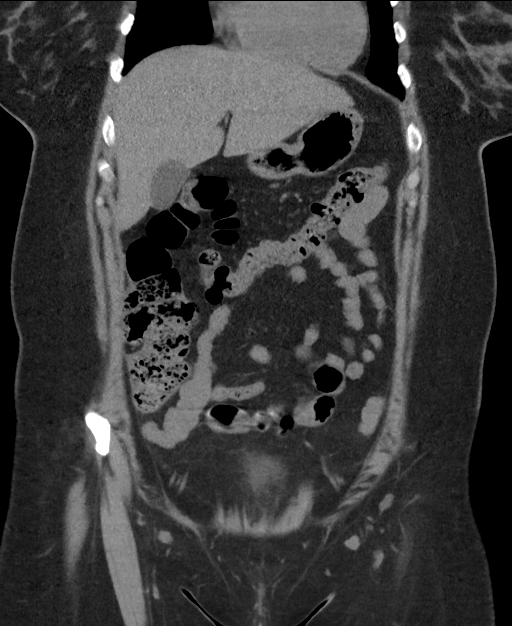
[im 36/81  soft-tissue]
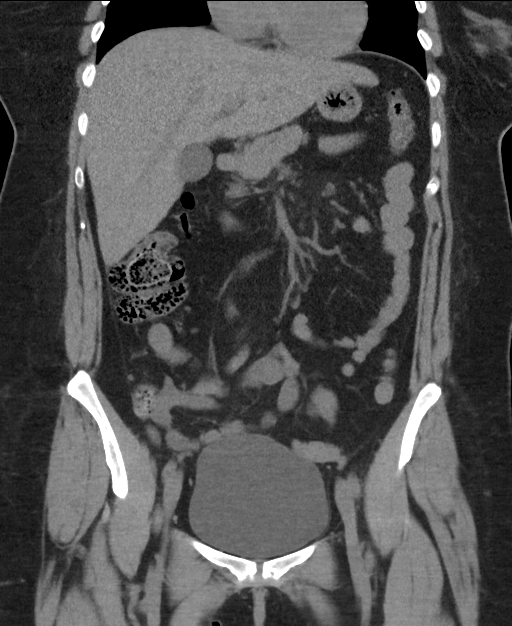
[im 45/81  soft-tissue]
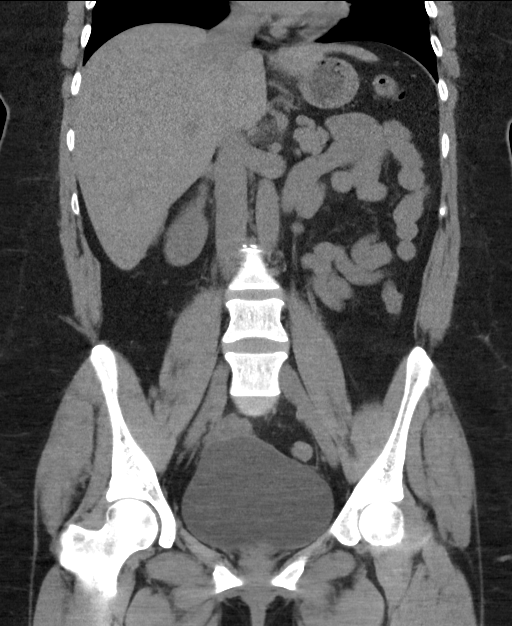

[16 of 46 positions shown; findings below may reference images not displayed]

FINDINGS: Lower chest: Lung bases are clear.

Hepatobiliary: No focal hepatic lesion. No biliary duct dilatation.
Gallbladder is normal. Common bile duct is normal.

Pancreas: Pancreas is normal. No ductal dilatation. No pancreatic
inflammation.

Spleen: Normal spleen

Adrenals/urinary tract: Normal adrenal glands. No nephrolithiasis
ureterolithiasis. No bladder calculi. No obstructive uropathy.

Stomach/Bowel: Stomach, small bowel, appendix, and cecum are normal.
The colon and rectosigmoid colon are normal.

Vascular/Lymphatic: Abdominal aorta is normal caliber. There is no
retroperitoneal or periportal lymphadenopathy. No pelvic
lymphadenopathy.

Reproductive: Post hysterectomy.  No adnexal abnormality

Other: No free fluid.  No inguinal hernia or ventral hernia.

Musculoskeletal: No aggressive osseous lesion.
IMPRESSION: 1. No nephrolithiasis or ureterolithiasis.
2. No bladder calculi.
3. No explanation for LEFT flank pain.
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
## 2018-10-09 ENCOUNTER — Other Ambulatory Visit: Payer: Self-pay

## 2018-10-09 DIAGNOSIS — Z20822 Contact with and (suspected) exposure to covid-19: Secondary | ICD-10-CM

## 2018-10-11 LAB — NOVEL CORONAVIRUS, NAA: SARS-CoV-2, NAA: NOT DETECTED

## 2021-09-04 ENCOUNTER — Encounter (HOSPITAL_COMMUNITY): Payer: Self-pay | Admitting: *Deleted

## 2021-09-04 ENCOUNTER — Other Ambulatory Visit: Payer: Self-pay

## 2021-09-04 ENCOUNTER — Emergency Department (HOSPITAL_COMMUNITY): Payer: 59

## 2021-09-04 ENCOUNTER — Emergency Department (HOSPITAL_COMMUNITY)
Admission: EM | Admit: 2021-09-04 | Discharge: 2021-09-04 | Disposition: A | Discharge status: Home / Self Care | Payer: 59 | Attending: Emergency Medicine | Admitting: Emergency Medicine

## 2021-09-04 DIAGNOSIS — R079 Chest pain, unspecified: Secondary | ICD-10-CM | POA: Diagnosis present

## 2021-09-04 DIAGNOSIS — R300 Dysuria: Secondary | ICD-10-CM | POA: Diagnosis not present

## 2021-09-04 DIAGNOSIS — I10 Essential (primary) hypertension: Secondary | ICD-10-CM | POA: Diagnosis not present

## 2021-09-04 DIAGNOSIS — R072 Precordial pain: Secondary | ICD-10-CM | POA: Diagnosis not present

## 2021-09-04 LAB — URINALYSIS, ROUTINE W REFLEX MICROSCOPIC
Bilirubin Urine: NEGATIVE
Glucose, UA: NEGATIVE mg/dL
Ketones, ur: NEGATIVE mg/dL
Nitrite: NEGATIVE
Protein, ur: NEGATIVE mg/dL
Specific Gravity, Urine: 1.005 (ref 1.005–1.030)
pH: 6 (ref 5.0–8.0)

## 2021-09-04 LAB — CBC
HCT: 37.2 % (ref 36.0–46.0)
Hemoglobin: 12.4 g/dL (ref 12.0–15.0)
MCH: 29.4 pg (ref 26.0–34.0)
MCHC: 33.3 g/dL (ref 30.0–36.0)
MCV: 88.2 fL (ref 80.0–100.0)
Platelets: 279 10*3/uL (ref 150–400)
RBC: 4.22 MIL/uL (ref 3.87–5.11)
RDW: 13.6 % (ref 11.5–15.5)
WBC: 7.2 10*3/uL (ref 4.0–10.5)
nRBC: 0 % (ref 0.0–0.2)

## 2021-09-04 LAB — BASIC METABOLIC PANEL
Anion gap: 7 (ref 5–15)
BUN: 10 mg/dL (ref 6–20)
CO2: 22 mmol/L (ref 22–32)
Calcium: 8.5 mg/dL — ABNORMAL LOW (ref 8.9–10.3)
Chloride: 111 mmol/L (ref 98–111)
Creatinine, Ser: 0.66 mg/dL (ref 0.44–1.00)
GFR, Estimated: >60 mL/min (ref >60)
Glucose, Bld: 92 mg/dL (ref 70–99)
Potassium: 3.5 mmol/L (ref 3.5–5.1)
Sodium: 140 mmol/L (ref 135–145)

## 2021-09-04 LAB — TROPONIN I (HIGH SENSITIVITY)
Troponin I (High Sensitivity): <2 ng/L (ref <18)
Troponin I (High Sensitivity): <2 ng/L (ref <18)

## 2021-09-04 MED ORDER — CEPHALEXIN 500 MG PO CAPS: 500.0000 mg | ORAL_CAPSULE | Freq: Four times a day (QID) | ORAL | 0 refills | Status: DC

## 2021-09-04 NOTE — Discharge Instructions (Signed)
Take the Keflex as directed for the probable urinary tract infection.  Urine culture has been sent.  You will be notified if the Keflex does not cover the infection.  Call cardiology for follow-up.  Return for any new or worse chest pain today's work-up for the chest pain negative.

## 2021-09-04 NOTE — ED Provider Notes (Signed)
[facility] EMERGENCY DEPARTMENT Provider Note   CSN: [id number] Arrival date & time: 09/04/21  1038     History  Chief Complaint  Patient presents with   Chest Pain    Makayla Wilson is a 46 y.o. female.  Patient with the onset of chest pain today pain left-sided chest radiating to her shoulder and back.  Patient had similar pain on August 18 while she was down in the [place].  Went to a clinic there and they check troponins and they said they were elevated but they did not recommended that she needed admission at that time.  She would have had to be transferred for admission.  Oxygen saturations here today are 100%.  Patient also with a complaint of dysuria that started on Friday.  Patient has no no history of chest pain in the past.  Past medical history significant for hypertension depression thyroid disease she had abdominal hysterectomy.  Patient never smoked.       Home Medications Prior to Admission medications   Medication Sig Start Date End Date Taking? Authorizing Provider  levothyroxine (SYNTHROID, LEVOTHROID) 50 MCG tablet Take 50 mcg by mouth daily before breakfast.   Yes [provider]  olmesartan-hydrochlorothiazide (BENICAR HCT) 40-25 MG tablet Take 1 tablet by mouth daily. 06/16/21  Yes [provider]  zolpidem (AMBIEN) 10 MG tablet Take 10 mg by mouth at bedtime. 06/10/21  Yes [provider]  cephALEXin (KEFLEX) 500 MG capsule Take 1 capsule (500 mg total) by mouth 4 (four) times daily. 09/04/21   Vanetta Mulders, MD  HYDROcodone-acetaminophen (NORCO) 5-325 MG tablet Take 1-2 tablets by mouth every 6 (six) hours as needed for severe pain. Patient not taking: Reported on 09/04/2021 02/09/16   Mesner, Barbara Cower, MD  ibuprofen (ADVIL,MOTRIN) 800 MG tablet Take 1 tablet (800 mg total) by mouth 3 (three) times daily. Patient not taking: Reported on 09/04/2021 02/09/16   Mesner, Barbara Cower, MD  ondansetron (ZOFRAN) 4 MG tablet Take 1 tablet (4 mg total) by  mouth every 8 (eight) hours as needed for nausea or vomiting. Patient not taking: Reported on 09/04/2021 02/09/16   Mesner, Barbara Cower, MD  tamsulosin (FLOMAX) 0.4 MG CAPS capsule Take 1 capsule (0.4 mg total) by mouth daily. Patient not taking: Reported on 09/04/2021 02/09/16   Mesner, Barbara Cower, MD      Allergies    Morphine and related, Neurontin [gabapentin], and Other    Review of Systems   Review of Systems  Constitutional:  Negative for chills and fever.  HENT:  Negative for ear pain and sore throat.   Eyes:  Negative for pain and visual disturbance.  Respiratory:  Negative for cough and shortness of breath.   Cardiovascular:  Positive for chest pain. Negative for palpitations.  Gastrointestinal:  Negative for abdominal pain and vomiting.  Genitourinary:  Positive for dysuria. Negative for hematuria.  Musculoskeletal:  Negative for arthralgias and back pain.  Skin:  Negative for color change and rash.  Neurological:  Negative for seizures and syncope.  All other systems reviewed and are negative.   Physical Exam Updated Vital Signs BP 117/71   Pulse 86   Temp 98.4 F (36.9 C) (Oral)   Resp (!) 21   Ht 1.549 m (5\' 1" )   Wt 88.5 kg   SpO2 100%   BMI 36.84 kg/m  Physical Exam Vitals and nursing note reviewed.  Constitutional:      General: She is not in acute distress.    Appearance: She is well-developed.  HENT:     Head: Normocephalic and atraumatic.  Eyes:     Conjunctiva/sclera: Conjunctivae normal.  Cardiovascular:     Rate and Rhythm: Normal rate and regular rhythm.     Heart sounds: No murmur heard. Pulmonary:     Effort: Pulmonary effort is normal. No respiratory distress.     Breath sounds: Normal breath sounds. No decreased breath sounds, wheezing, rhonchi or rales.  Abdominal:     Palpations: Abdomen is soft.     Tenderness: There is no abdominal tenderness.  Musculoskeletal:        General: No swelling.     Cervical back: Neck supple.     Right lower leg: No  edema.     Left lower leg: No edema.  Skin:    General: Skin is warm and dry.     Capillary Refill: Capillary refill takes less than 2 seconds.  Neurological:     General: No focal deficit present.     Mental Status: She is alert and oriented to person, place, and time.  Psychiatric:        Mood and Affect: Mood normal.     ED Results / Procedures / Treatments   Labs (all labs ordered are listed, but only abnormal results are displayed) Labs Reviewed  BASIC METABOLIC PANEL - Abnormal; Notable for the following components:      Result Value   Calcium 8.5 (*)    All other components within normal limits  URINALYSIS, ROUTINE W REFLEX MICROSCOPIC - Abnormal; Notable for the following components:   Hgb urine dipstick SMALL (*)    Leukocytes,Ua MODERATE (*)    Bacteria, UA RARE (*)    All other components within normal limits  URINE CULTURE  CBC  TROPONIN I (HIGH SENSITIVITY)  TROPONIN I (HIGH SENSITIVITY)    EKG EKG Interpretation  Date/Time:  Monday September 04 2021 11:10:21 EDT Ventricular Rate:  81 PR Interval:  140 QRS Duration: 106 QT Interval:  406 QTC Calculation: 472 R Axis:   32 Text Interpretation: Sinus rhythm Low voltage, precordial leads Abnormal R-wave progression, early transition Confirmed by Vanetta Mulders [phone]) on 09/04/2021 1:35:40 PM  Radiology DG Chest Port 1 View  Result Date: 09/04/2021 CLINICAL DATA:  Chest pain. EXAM: PORTABLE CHEST 1 VIEW COMPARISON:  April 19, 2015. FINDINGS: The heart size and mediastinal contours are within normal limits. Both lungs are clear. The visualized skeletal structures are unremarkable. IMPRESSION: No active disease. Electronically Signed   By: Lupita Raider M.D.   On: 09/04/2021 11:47    Procedures Procedures    Medications Ordered in ED Medications - No data to display  ED Course/ Medical Decision Making/ A&P                           Medical Decision Making Amount and/or Complexity of Data  Reviewed Labs: ordered.  Risk Prescription drug management.   Work-up here troponins x2 were normal.  Very reassuring.  Chest x-ray no acute findings EKG without any acute findings.  CBC no leukocytosis hemoglobin normal basic metabolic panel normal potassium is normal renal functions normal.  Urinalysis has moderate leukocytes white blood cells 21-50. Only rare bacteria.  The patient certainly symptomatic so we will treat.  We will treat with Keflex.  Urine sent for culture.  We will refer to cardiology for additional work-up.  Patient will return for any new or worse symptoms. Final Clinical Impression(s) / ED Diagnoses Final  diagnoses:  Precordial pain  Dysuria    Rx / DC Orders ED Discharge Orders          Ordered    cephALEXin (KEFLEX) 500 MG capsule  4 times daily,   Status:  Discontinued        09/04/21 1346    cephALEXin (KEFLEX) 500 MG capsule  4 times daily        09/04/21 1347              Vanetta Mulders, MD 09/04/21 1357

## 2021-09-04 NOTE — ED Triage Notes (Signed)
Pt states she had chest pain on august 18th while in the [place] and went to a clinic there and had elevated troponins; pt states she has come back home and started having chest pain today with pain to left side of chest radiating to her shoulder and back;  Pt has sob with exertion and states she has nausea and dizziness when the chest pain occurs

## 2021-09-06 LAB — URINE CULTURE: Culture: >=100000 — AB

## 2021-09-07 ENCOUNTER — Telehealth: Payer: Self-pay | Admitting: *Deleted

## 2021-09-07 NOTE — Telephone Encounter (Signed)
Post ED Visit - Positive Culture Follow-up  Culture report reviewed by antimicrobial stewardship pharmacist: [facility] []  , Pharm.D. []  Enzo Bi, Pharm.D., BCPS AQ-ID []  , Pharm.D., BCPS []  Celedonio Miyamoto, Pharm.D., BCPS []  [place], Garvin Fila.D., BCPS, AAHIVP []  , Pharm.D., BCPS, AAHIVP []  Georgina Pillion, PharmD, BCPS []  , PharmD, BCPS []  [place], PharmD, BCPS []  [address], PharmD []  , PharmD, BCPS []  Estella Husk, PharmD  Pharmacy Team []  Lysle Pearl, PharmD []  , PharmD []  Phillips Climes, PharmD []  , Rph []  Agapito Games) , PharmD []  Verlan Friends, PharmD []  , PharmD []  Mervyn Gay, PharmD []  , PharmD []  Vinnie Level, PharmD []  [facility], PharmD []  , PharmD []  Len Childs, PharmD   Positive urine culture Treated with Cephalexin, organism sensitive to the same and no further patient follow-up is required at this time. Reviewed by Pharmacy  09/07/2021, 9:35 AM

## 2021-09-13 NOTE — Progress Notes (Signed)
Cardiology Office Note   Date:  09/14/2021   ID:  Makayla Wilson, DOB 1975-08-07, MRN [id number]  PCP:  Assunta Found, MD  Cardiologist:   None Referring:  Assunta Found, MD   Chief Complaint  Patient presents with   Chest Pain      History of Present Illness: Makayla Wilson is a 46 y.o. female who presents for evaluation of chest pain. She was in the ED for this on 9/4.  I reviewed these records for this visit.  She had no objective evidence of ischemia.  She is referred by Assunta Found, MD she has no past cardiac history other than I saw negative stress echocardiogram in 2015.  She does have a very strong family history of early coronary artery disease.  She actually lives in a [address] and is only here because of this discomfort and is staying with her kids but she needs to go back home in the next few days.  She reports sharp pain.  This started on 18 August.  It went into her upper epigastric area.  She got nauseated and dizzy.  It lasted all day.  She did not go to an emergency room till the next day.  It looks like they had troponin with the upper limit of normal being 0.8 and hers was 1.1.  It does not appear that this was high-sensitivity troponin.  The next day was down to 0.8.  She has not had the sharp discomfort that she was having.  However, she is having some ongoing shortness of breath since then.  She has not been active because she is going to avoid this.  She feels lightheaded.  She is very anxious and tearful.  She feels like there is an upper chest squeezing that is different than it was with slight radiation to her jaw.  Her right arm is numb.  She had some increased swelling in her feet.  Prior to all of this she has not been having discomfort.  She was walking and swimming.  She has been sleeping on 2 pillows but she is not describing classic PND or orthopnea.  She does not have any presyncope or syncope.      Past Medical History:  Diagnosis Date    Anxiety    Depression    Essential hypertension, benign    GERD (gastroesophageal reflux disease)    PONV (postoperative nausea and vomiting)    Thyroid disease     Past Surgical History:  Procedure Laterality Date   ABDOMINAL HYSTERECTOMY     CESAREAN SECTION     CYST REMOVAL TRUNK N/A 02/16/2013   Procedure: EXCISION SEBACEOUS CYST ABDOMINAL WALL;  Surgeon: Dalia Heading, MD;  Location: [facility];  Service: General;  Laterality: N/A;   RECONSTRUCTION ADBOMINAL WALL       Current Outpatient Medications  Medication Sig Dispense Refill   ibuprofen (ADVIL,MOTRIN) 800 MG tablet Take 1 tablet (800 mg total) by mouth 3 (three) times daily. 21 tablet 0   levothyroxine (SYNTHROID, LEVOTHROID) 50 MCG tablet Take 50 mcg by mouth daily before breakfast.     metoprolol tartrate (LOPRESSOR) 100 MG tablet Take 1 tablet by mouth tonight, and 1 tablet by mouth two hours before CT 2 tablet 0   olmesartan-hydrochlorothiazide (BENICAR HCT) 40-25 MG tablet Take 1 tablet by mouth daily.     No current facility-administered medications for this visit.    Allergies:   Morphine and related, Neurontin [gabapentin], and Other  Social History:  The patient  reports that she has never smoked. She has never used smokeless tobacco. She reports current alcohol use. She reports that she does not use drugs. Lives at home in [place], [place].    Family History:  The patient's family history includes Anxiety disorder in her father; CAD (age of onset: 63) in her mother; CAD (age of onset: 48) in her father; CAD (age of onset: 65) in her brother; COPD in her mother; Depression in her sister and another family member; Peripheral vascular disease in her father.    ROS:  Please see the history of present illness.   Otherwise, review of systems are positive for none.   All other systems are reviewed and negative.    PHYSICAL EXAM: VS:  BP 130/82   Pulse 96   Ht 5\' 1"  (1.549 m)   Wt 204 lb 3.2 oz (92.6 kg)   SpO2  100%   BMI 38.58 kg/m  , BMI Body mass index is 38.58 kg/m. GENERAL:  Well appearing HEENT:  Pupils equal round and reactive, fundi not visualized, oral mucosa unremarkable NECK:  No jugular venous distention, waveform within normal limits, carotid upstroke brisk and symmetric, no bruits, no thyromegaly LYMPHATICS:  No cervical, inguinal adenopathy LUNGS:  Clear to auscultation bilaterally BACK:  No CVA tenderness CHEST:  Unremarkable HEART:  PMI not displaced or sustained,S1 and S2 within normal limits, no S3, no S4, no clicks, no rubs, soft left upper sternal border brief systolic murmur early peaking, no diastolic murmurs ABD:  Flat, positive bowel sounds normal in frequency in pitch, no bruits, no rebound, no guarding, no midline pulsatile mass, no hepatomegaly, no splenomegaly EXT:  2 plus pulses throughout, no edema, no cyanosis no clubbing SKIN:  No rashes no nodules NEURO:  Cranial nerves II through XII grossly intact, motor grossly intact throughout PSYCH:  Cognitively intact, oriented to person place and time    EKG:  EKG is not ordered today. The ekg ordered 09/04/2021 demonstrates sinus rhythm, rate 104, axis within normal limits, intervals within normal limits, no acute ST-T wave changes.   Recent Labs: 09/04/2021: BUN 10; Creatinine, Ser 0.66; Hemoglobin 12.4; Platelets 279; Potassium 3.5; Sodium 140    Lipid Panel No results found for: "CHOL", "TRIG", "HDL", "CHOLHDL", "VLDL", "LDLCALC", "LDLDIRECT"    Wt Readings from Last 3 Encounters:  09/14/21 204 lb 3.2 oz (92.6 kg)  09/04/21 195 lb (88.5 kg)  02/09/16 179 lb (81.2 kg)      Other studies Reviewed: Additional studies/ records that were reviewed today include: ED records, primary care records. Review of the above records demonstrates:  Please see elsewhere in the note.     ASSESSMENT AND PLAN:  Chest pain: Chest discomfort has some anginal features but also nonanginal features without objective ischemia.   She does have a strong family history of coronary artery disease.  I think the pretest probability of obstructive coronary disease is at least moderately high and I would like to attempt coronary CTA.  We would have to get her heart rate down.  We are trying to figure out the timing of this given her need to travel out of the country.  I would like her to start a baby aspirin and we discussed this.  Shortness of breath: I will check a BNP level.  Other labs were unremarkable in the emergency room.  Murmur: I suspect this is a flow murmur and I would repeat an echocardiogram in October when she is  back in the country.  Tachycardia: She is emotionally upset.  I do see thyroid was normal.  Electrolytes are normal.  We will be managing as above and evaluating.   Current medicines are reviewed at length with the patient today.  The patient does not have concerns regarding medicines.  The following changes have been made:  no change  Labs/ tests ordered today include:   Orders Placed This Encounter  Procedures   CT CORONARY MORPH W/CTA COR W/SCORE W/CA W/CM &/OR WO/CM   Basic metabolic panel     Disposition:   FU with me after the above testing.      Signed, Rollene Rotunda, MD  09/14/2021 9:37 AM    [facility]

## 2021-09-14 ENCOUNTER — Encounter: Payer: Self-pay | Admitting: Cardiology

## 2021-09-14 ENCOUNTER — Telehealth (HOSPITAL_COMMUNITY): Payer: Self-pay | Admitting: Emergency Medicine

## 2021-09-14 ENCOUNTER — Ambulatory Visit: Payer: 59 | Attending: Cardiology | Admitting: Cardiology

## 2021-09-14 VITALS — BP 130/82 | HR 96 | Ht 61.0 in | Wt 204.2 lb

## 2021-09-14 DIAGNOSIS — R072 Precordial pain: Secondary | ICD-10-CM | POA: Diagnosis not present

## 2021-09-14 DIAGNOSIS — I10 Essential (primary) hypertension: Secondary | ICD-10-CM

## 2021-09-14 LAB — BASIC METABOLIC PANEL
BUN/Creatinine Ratio: 13 (ref 9–23)
BUN: 8 mg/dL (ref 6–24)
CO2: 25 mmol/L (ref 20–29)
Calcium: 9.1 mg/dL (ref 8.7–10.2)
Chloride: 102 mmol/L (ref 96–106)
Creatinine, Ser: 0.64 mg/dL (ref 0.57–1.00)
Glucose: 93 mg/dL (ref 70–99)
Potassium: 3.7 mmol/L (ref 3.5–5.2)
Sodium: 138 mmol/L (ref 134–144)
eGFR: 111 mL/min/{1.73_m2} (ref >59)

## 2021-09-14 MED ORDER — METOPROLOL TARTRATE 100 MG PO TABS
ORAL_TABLET | ORAL | 0 refills | Status: AC
Start: 2021-09-14 — End: ?

## 2021-09-14 NOTE — Telephone Encounter (Signed)
Attempted to call patient regarding upcoming cardiac CT appointment. °Left message on voicemail with name and callback number °Natoya Viscomi RN Navigator Cardiac Imaging °[facility] °[phone] Office °[phone] Cell ° °

## 2021-09-14 NOTE — Patient Instructions (Addendum)
Medication Instructions:  Take Metoprolol 100 mg tonight, and Metoprolol 100 mg two hours before CT  *If you need a refill on your cardiac medications before your next appointment, please call your pharmacy*   Lab Work: BMET today   If you have labs (blood work) drawn today and your tests are completely normal, you will receive your results only by: MyChart Message (if you have MyChart) OR A paper copy in the mail If you have any lab test that is abnormal or we need to change your treatment, we will call you to review the results.   Testing/Procedures:  Your physician has requested that you have cardiac CT. Cardiac computed tomography (CT) is a painless test that uses an x-ray machine to take clear, detailed pictures of your heart. For further information please visit [email or web address] Please follow instruction sheet as given.     Follow-Up: At [facility], you and your health needs are our priority.  As part of our continuing mission to provide you with exceptional heart care, we have created designated Provider Care Teams.  These Care Teams include your primary Cardiologist (physician) and Advanced Practice Providers (APPs -  Physician Assistants and Nurse Practitioners) who all work together to provide you with the care you need, when you need it.  We recommend signing up for the patient portal called "MyChart".  Sign up information is provided on this After Visit Summary.  MyChart is used to connect with patients for Virtual Visits (Telemedicine).  Patients are able to view lab/test results, encounter notes, upcoming appointments, etc.  Non-urgent messages can be sent to your provider as well.   To learn more about what you can do with MyChart, go to [email or web address].    Your next appointment:   Based on results (we will call you)  The format for your next appointment:   In Person  Provider:   Rollene Rotunda, MD  Other Instructions   Your cardiac CT will  be scheduled at one of the below locations:   [facility] [address] [place], [place] [address] [phone]  If scheduled at [facility], please arrive at the [facility] and Children's Entrance (Entrance C2) of [facility] 30 minutes prior to test start time. You can use the FREE valet parking offered at entrance C (encouraged to control the heart rate for the test)  Proceed to the [facility] Radiology Department (first floor) to check-in and test prep.  All radiology patients and guests should use entrance C2 at [facility], accessed from [facility], even though the hospital's physical address listed is [address]      Please follow these instructions carefully (unless otherwise directed):  On the Night Before the Test: Be sure to Drink plenty of water. Do not consume any caffeinated/decaffeinated beverages or chocolate 12 hours prior to your test. Do not take any antihistamines 12 hours prior to your test.  On the Day of the Test: Drink plenty of water until 1 hour prior to the test. You may take your regular medications prior to the test.  Take metoprolol (Lopressor) two hours prior to test. HOLD Furosemide/Hydrochlorothiazide morning of the test. FEMALES- please wear underwire-free bra if available, avoid dresses & tight clothing      After the Test: Drink plenty of water. After receiving IV contrast, you may experience a mild flushed feeling. This is normal. On occasion, you may experience a mild rash up to 24 hours after the  test. This is not dangerous. If this occurs, you can take Benadryl 25 mg and increase your fluid intake. If you experience trouble breathing, this can be serious. If it is severe call 911 IMMEDIATELY. If it is mild, please call our office. If you take any of these medications: Glipizide/Metformin, Avandament, Glucavance, please do not take 48 hours after completing test unless otherwise  instructed.  We will call to schedule your test 2-4 weeks out understanding that some insurance companies will need an authorization prior to the service being performed.   For non-scheduling related questions, please contact the cardiac imaging nurse navigator should you have any questions/concerns: Rockwell Alexandria, Cardiac Imaging Nurse Navigator Larey Brick, Cardiac Imaging Nurse Navigator [facility] Direct Office Dial: [phone]   For scheduling needs, including cancellations and rescheduling, please call [place], [phone].

## 2021-09-15 ENCOUNTER — Ambulatory Visit (HOSPITAL_COMMUNITY)
Admission: RE | Admit: 2021-09-15 | Discharge: 2021-09-15 | Disposition: A | Discharge status: Home / Self Care | Payer: 59 | Source: Ambulatory Visit | Attending: Cardiology | Admitting: Cardiology

## 2021-09-15 ENCOUNTER — Encounter (HOSPITAL_COMMUNITY): Payer: Self-pay

## 2021-09-15 DIAGNOSIS — R072 Precordial pain: Secondary | ICD-10-CM | POA: Diagnosis not present

## 2021-09-15 MED ORDER — NITROGLYCERIN 0.4 MG SL SUBL
SUBLINGUAL_TABLET | SUBLINGUAL | Status: AC
  Administered 2021-09-15: 0.8 mg via SUBLINGUAL
  Filled 2021-09-15: qty 2

## 2021-09-15 MED ORDER — NITROGLYCERIN 0.4 MG SL SUBL: 0.8000 mg | SUBLINGUAL_TABLET | Freq: Once | SUBLINGUAL | Status: AC

## 2021-09-15 MED ORDER — IOHEXOL 350 MG/ML SOLN
100.0000 mL | Freq: Once | INTRAVENOUS | Status: AC | PRN
  Administered 2021-09-15: 100 mL via INTRAVENOUS

## 2021-09-19 ENCOUNTER — Telehealth: Payer: Self-pay | Admitting: Cardiology

## 2021-09-19 NOTE — Telephone Encounter (Signed)
   Pt is calling to get CT result. She said, she did not see the mychart result

## 2021-09-19 NOTE — Telephone Encounter (Signed)
Patient made aware of results and verbalized understanding.    Minus Breeding, MD  09/16/2021 10:52 AM EDT     No evidence of CAD or coronary calcium.  I do not see the over read of the radiologists for non cardiac findings however.  We can release results but I am waiting for that part

## 2021-09-20 ENCOUNTER — Encounter: Payer: Self-pay | Admitting: Cardiology

## 2022-06-22 ENCOUNTER — Other Ambulatory Visit (HOSPITAL_COMMUNITY): Payer: Self-pay | Admitting: Family Medicine

## 2022-06-22 DIAGNOSIS — N62 Hypertrophy of breast: Secondary | ICD-10-CM

## 2022-06-27 ENCOUNTER — Ambulatory Visit (HOSPITAL_COMMUNITY)
Admission: RE | Admit: 2022-06-27 | Discharge: 2022-06-27 | Disposition: A | Discharge status: Home / Self Care | Payer: 59 | Source: Ambulatory Visit | Attending: Family Medicine | Admitting: Family Medicine

## 2022-06-27 DIAGNOSIS — Z1231 Encounter for screening mammogram for malignant neoplasm of breast: Secondary | ICD-10-CM | POA: Insufficient documentation

## 2022-06-27 DIAGNOSIS — N62 Hypertrophy of breast: Secondary | ICD-10-CM | POA: Diagnosis present

## 2022-06-27 DIAGNOSIS — R92333 Mammographic heterogeneous density, bilateral breasts: Secondary | ICD-10-CM | POA: Diagnosis not present

## 2022-12-29 ENCOUNTER — Emergency Department (HOSPITAL_COMMUNITY)
Admission: EM | Admit: 2022-12-29 | Discharge: 2022-12-29 | Discharge status: Against Medical Advice | Payer: 59 | Attending: Emergency Medicine | Admitting: Emergency Medicine

## 2022-12-29 ENCOUNTER — Other Ambulatory Visit: Payer: Self-pay

## 2022-12-29 ENCOUNTER — Encounter (HOSPITAL_COMMUNITY): Payer: Self-pay

## 2022-12-29 DIAGNOSIS — R1031 Right lower quadrant pain: Secondary | ICD-10-CM | POA: Diagnosis present

## 2022-12-29 DIAGNOSIS — Z5321 Procedure and treatment not carried out due to patient leaving prior to being seen by health care provider: Secondary | ICD-10-CM | POA: Insufficient documentation

## 2022-12-29 LAB — CBC
HCT: 42.1 % (ref 36.0–46.0)
Hemoglobin: 14.5 g/dL (ref 12.0–15.0)
MCH: 29.8 pg (ref 26.0–34.0)
MCHC: 34.4 g/dL (ref 30.0–36.0)
MCV: 86.4 fL (ref 80.0–100.0)
Platelets: 240 10*3/uL (ref 150–400)
RBC: 4.87 MIL/uL (ref 3.87–5.11)
RDW: 13.8 % (ref 11.5–15.5)
WBC: 4.7 10*3/uL (ref 4.0–10.5)
nRBC: 0 % (ref 0.0–0.2)

## 2022-12-29 LAB — COMPREHENSIVE METABOLIC PANEL
ALT: 27 U/L (ref 0–44)
AST: 25 U/L (ref 15–41)
Albumin: 4.3 g/dL (ref 3.5–5.0)
Alkaline Phosphatase: 65 U/L (ref 38–126)
Anion gap: 12 (ref 5–15)
BUN: 7 mg/dL (ref 6–20)
CO2: 21 mmol/L — ABNORMAL LOW (ref 22–32)
Calcium: 9.7 mg/dL (ref 8.9–10.3)
Chloride: 97 mmol/L — ABNORMAL LOW (ref 98–111)
Creatinine, Ser: 0.8 mg/dL (ref 0.44–1.00)
GFR, Estimated: >60 mL/min (ref >60)
Glucose, Bld: 98 mg/dL (ref 70–99)
Potassium: 3.5 mmol/L (ref 3.5–5.1)
Sodium: 130 mmol/L — ABNORMAL LOW (ref 135–145)
Total Bilirubin: 0.8 mg/dL (ref <1.2)
Total Protein: 7.6 g/dL (ref 6.5–8.1)

## 2022-12-29 LAB — LIPASE, BLOOD: Lipase: 24 U/L (ref 11–51)

## 2022-12-29 NOTE — ED Triage Notes (Signed)
Pt arrives with pain to RLQ starting Christmas night reports that the pain has become constant and throbs. No history of abdominal surgeries. Last BM today, was WNL. Some nausea, no vomiting.

## 2022-12-29 NOTE — ED Provider Triage Note (Cosign Needed)
Emergency Medicine Provider Triage Evaluation Note  Makayla Wilson , a 47 y.o. female  was evaluated in triage.  Pt complains of right lower quadrant pain and started on 12/20 5 at night and felt like a pulled muscle and gradually worsening to where today is constant aching pain in the right lower quadrant.  Denies nausea or vomiting, no fevers or chills.  History of prior hysterectomy but no other abdominal surgeries..  Review of Systems  Positive: Right lower quadrant abdominal pain Negative: , Vomiting  Physical Exam  BP (!) 138/97 (BP Location: Right Arm)   Pulse 98   Temp 99.1 F (37.3 C) (Oral)   Resp 20   Ht 5\' 1"  (1.549 m)   Wt 83.9 kg   SpO2 100%   BMI 34.96 kg/m  Gen:   Awake, no distress   Resp:  Normal effort  MSK:   Moves extremities without difficulty  Other:  Hematemesis patient is in a chair that abdomen is soft, right lower quadrant tenderness but no guarding or rigidity appreciated on limiting exam  Medical Decision Making  Medically screening exam initiated at 6:35 PM.  Appropriate orders placed.  Makayla Wilson was informed that the remainder of the evaluation will be completed by another provider, this initial triage assessment does not replace that evaluation, and the importance of remaining in the ED until their evaluation is complete.     Ma Rings, [place] 12/29/22 1836

## 2023-09-04 ENCOUNTER — Other Ambulatory Visit (HOSPITAL_COMMUNITY): Payer: Self-pay | Admitting: Family Medicine

## 2023-09-04 DIAGNOSIS — Z1231 Encounter for screening mammogram for malignant neoplasm of breast: Secondary | ICD-10-CM

## 2023-09-09 ENCOUNTER — Ambulatory Visit (HOSPITAL_COMMUNITY)
Admission: RE | Admit: 2023-09-09 | Discharge: 2023-09-09 | Disposition: A | Discharge status: Home / Self Care | Source: Ambulatory Visit | Attending: Family Medicine | Admitting: Family Medicine

## 2023-09-09 DIAGNOSIS — R928 Other abnormal and inconclusive findings on diagnostic imaging of breast: Secondary | ICD-10-CM | POA: Diagnosis not present

## 2023-09-09 DIAGNOSIS — Z1231 Encounter for screening mammogram for malignant neoplasm of breast: Secondary | ICD-10-CM | POA: Diagnosis present

## 2023-09-16 ENCOUNTER — Other Ambulatory Visit (HOSPITAL_COMMUNITY): Payer: Self-pay | Admitting: Family Medicine

## 2023-09-16 DIAGNOSIS — R928 Other abnormal and inconclusive findings on diagnostic imaging of breast: Secondary | ICD-10-CM

## 2023-10-05 DIAGNOSIS — R011 Cardiac murmur, unspecified: Secondary | ICD-10-CM | POA: Insufficient documentation

## 2023-10-05 DIAGNOSIS — R0602 Shortness of breath: Secondary | ICD-10-CM | POA: Insufficient documentation

## 2023-10-05 NOTE — Progress Notes (Unsigned)
  Cardiology Office Note:   Date:  10/08/2023  ID:  Khylee Algeo, DOB 04/07/1975, MRN [id number] PCP: Marvine Rush, MD  [facility] Providers Cardiologist:  None {  History of Present Illness:   Makayla Wilson is a 48 y.o. female who presents for evaluation of chest pain. She was in the ED for this on 9/4.  I reviewed these records for this visit.  She had no objective evidence of ischemia.  She is referred by Marvine Rush, MD she has no past cardiac history other than I saw negative stress echocardiogram in 2015.   I saw her in Sept 2023 for evaluation of sharp chest pain.   CTA demonstrated no calcium and no CAD.    She did have a murmur and was to get an echo but she lives part time in [place] and I do not think she had it done because she went back there.  She presents now because she has been getting palpitations for about a year.  She feels a skipping heartbeat.  It is daily.  She gets dizzy with it.  Feels like it will skip in and be normal and skip.  She is not describing sustained tachypalpitations.  There has been no clear syncope.  She describes episodic lower extremity swelling though it is very mild today she said it was worse recently.   She has been getting short of breath for about 6 months.  This has been slowly progressive and she is dyspneic walking level ground.  She saw her primary provider.  She was referred here.  She denies cough fevers or chills.  Her weight is up slightly.  ROS: As stated in the HPI and negative for all other systems.  Studies Reviewed:    EKG:   EKG Interpretation Date/Time:  Tuesday October 08 2023 13:54:48 EDT Ventricular Rate:  83 PR Interval:  130 QRS Duration:  80 QT Interval:  380 QTC Calculation: 446 R Axis:   17  Text Interpretation: Normal sinus rhythm Normal ECG When compared with ECG of 04-Sep-2021 11:10, No significant change since last tracing Confirmed by Lavona Agent ([id number]) on 10/08/2023 2:10:37 PM     Risk  Assessment/Calculations:              Physical Exam:   VS:  BP 130/80   Pulse 83   Ht 5' 1 (1.549 m)   Wt 197 lb (89.4 kg)   SpO2 100%   BMI 37.22 kg/m    Wt Readings from Last 3 Encounters:  10/08/23 197 lb (89.4 kg)  12/29/22 185 lb (83.9 kg)  09/14/21 204 lb 3.2 oz (92.6 kg)     GEN: Well nourished, well developed in no acute distress NECK: No JVD; No carotid bruits CARDIAC: RRR, very soft brief left upper sternal border systolic murmur nonradiating, no diastolic murmurs, rubs, gallops RESPIRATORY:  Clear to auscultation without rales, wheezing or rhonchi  ABDOMEN: Soft, non-tender, non-distended EXTREMITIES: Trace pretibial edema; No deformity   ASSESSMENT AND PLAN:      Shortness of breath: I will check a BNP level and an echocardiogram.  If these are unremarkable I would suggest suggest referral to pulmonary.  Murmur: This will be assessed as abov  Tachycardia:   I will arrange a 3-day ZIO.     I did review her blood work and her electrolytes and TSH were unremarkable.  Follow up with me based on the results of the above.  Signed, Agent Lavona, MD

## 2023-10-08 ENCOUNTER — Ambulatory Visit (HOSPITAL_COMMUNITY)
Admission: RE | Admit: 2023-10-08 | Discharge: 2023-10-08 | Disposition: A | Source: Ambulatory Visit | Attending: Family Medicine | Admitting: Family Medicine

## 2023-10-08 ENCOUNTER — Encounter (HOSPITAL_COMMUNITY): Payer: Self-pay

## 2023-10-08 ENCOUNTER — Encounter: Payer: Self-pay | Admitting: Cardiology

## 2023-10-08 ENCOUNTER — Ambulatory Visit (INDEPENDENT_AMBULATORY_CARE_PROVIDER_SITE_OTHER)

## 2023-10-08 ENCOUNTER — Ambulatory Visit: Admitting: Cardiology

## 2023-10-08 ENCOUNTER — Ambulatory Visit (HOSPITAL_COMMUNITY)
Admission: RE | Admit: 2023-10-08 | Discharge: 2023-10-08 | Disposition: A | Discharge status: Home / Self Care | Source: Ambulatory Visit | Attending: Family Medicine | Admitting: Family Medicine

## 2023-10-08 VITALS — BP 130/80 | HR 83 | Ht 61.0 in | Wt 197.0 lb

## 2023-10-08 DIAGNOSIS — R072 Precordial pain: Secondary | ICD-10-CM | POA: Insufficient documentation

## 2023-10-08 DIAGNOSIS — R002 Palpitations: Secondary | ICD-10-CM

## 2023-10-08 DIAGNOSIS — R011 Cardiac murmur, unspecified: Secondary | ICD-10-CM | POA: Insufficient documentation

## 2023-10-08 DIAGNOSIS — R0602 Shortness of breath: Secondary | ICD-10-CM | POA: Diagnosis present

## 2023-10-08 DIAGNOSIS — R928 Other abnormal and inconclusive findings on diagnostic imaging of breast: Secondary | ICD-10-CM | POA: Diagnosis present

## 2023-10-08 NOTE — Progress Notes (Unsigned)
 Enrolled patient for a 3 day Zio XT monitor to be mailed to patients home

## 2023-10-08 NOTE — Patient Instructions (Signed)
 Medication Instructions:  Your physician recommends that you continue on your current medications as directed. Please refer to the Current Medication list given to you today.  *If you need a refill on your cardiac medications before your next appointment, please call your pharmacy*  Lab Work: BNP today at [facility] If you have labs (blood work) drawn today and your tests are completely normal, you will receive your results only by: MyChart Message (if you have MyChart) OR A paper copy in the mail If you have any lab test that is abnormal or we need to change your treatment, we will call you to review the results.  Testing/Procedures: Echocardiogram  Your physician has requested that you have an echocardiogram. Echocardiography is a painless test that uses sound waves to create images of your heart. It provides your doctor with information about the size and shape of your heart and how well your heart's chambers and valves are working. This procedure takes approximately one hour. There are no restrictions for this procedure. Please do NOT wear cologne, perfume, aftershave, or lotions (deodorant is allowed). Please arrive 15 minutes prior to your appointment time.  Please note: We ask at that you not bring children with you during ultrasound (echo/ vascular) testing. Due to room size and safety concerns, children are not allowed in the ultrasound rooms during exams. Our front office staff cannot provide observation of children in our lobby area while testing is being conducted. An adult accompanying a patient to their appointment will only be allowed in the ultrasound room at the discretion of the ultrasound technician under special circumstances. We apologize for any inconvenience.  3 Day Zio Heart Monitor Your physician has requested that you wear a Zio heart monitor for __3___ days. This will be mailed to your home with instructions on how to apply the monitor and how to return it when finished.  Please allow 2 weeks after returning the heart monitor before our office calls you with the results.   Follow-Up: At [facility], you and your health needs are our priority.  As part of our continuing mission to provide you with exceptional heart care, our providers are all part of one team.  This team includes your primary Cardiologist (physician) and Advanced Practice Providers or APPs (Physician Assistants and Nurse Practitioners) who all work together to provide you with the care you need, when you need it.  Your next appointment:   As needed  Provider:   Lavona, MD  We recommend signing up for the patient portal called MyChart.  Sign up information is provided on this After Visit Summary.  MyChart is used to connect with patients for Virtual Visits (Telemedicine).  Patients are able to view lab/test results, encounter notes, upcoming appointments, etc.  Non-urgent messages can be sent to your provider as well.   To learn more about what you can do with MyChart, go to [email or web address].   Other Instructions ZIO XT- Long Term Monitor Instructions  Your physician has requested you wear a ZIO patch monitor for 3 days.  This is a single patch monitor. [employer] supplies one patch monitor per enrollment. Additional stickers are not available. Please do not apply patch if you will be having a Nuclear Stress Test,  Echocardiogram, Cardiac CT, MRI, or Chest Xray during the period you would be wearing the  monitor. The patch cannot be worn during these tests. You cannot remove and re-apply the  ZIO XT patch monitor.  Your ZIO patch monitor will be mailed  3 day USPS to your address on file. It may take 3-5 days  to receive your monitor after you have been enrolled.  Once you have received your monitor, please review the enclosed instructions. Your monitor  has already been registered assigning a specific monitor serial # to you.  Billing and Patient Assistance Program  Information  We have supplied [employer] with any of your insurance information on file for billing purposes. [employer] offers a sliding scale Patient Assistance Program for patients that do not have  insurance, or whose insurance does not completely cover the cost of the ZIO monitor.  You must apply for the Patient Assistance Program to qualify for this discounted rate.  To apply, please call [employer] at [phone], select option 4, select option 2, ask to apply for  Patient Assistance Program. Meredeth will ask your household income, and how many people  are in your household. They will quote your out-of-pocket cost based on that information.  [employer] will also be able to set up a 76-month, interest-free payment plan if needed.  Applying the monitor   Shave hair from upper left chest.  Hold abrader disc by orange tab. Rub abrader in 40 strokes over the upper left chest as  indicated in your monitor instructions.  Clean area with 4 enclosed alcohol pads. Let dry.  Apply patch as indicated in monitor instructions. Patch will be placed under collarbone on left  side of chest with arrow pointing upward.  Rub patch adhesive wings for 2 minutes. Remove white label marked 1. Remove the white  label marked 2. Rub patch adhesive wings for 2 additional minutes.  While looking in a mirror, press and release button in center of patch. A small green light will  flash 3-4 times. This will be your only indicator that the monitor has been turned on.  Do not shower for the first 24 hours. You may shower after the first 24 hours.  Press the button if you feel a symptom. You will hear a small click. Record Date, Time and  Symptom in the Patient Logbook.  When you are ready to remove the patch, follow instructions on the last 2 pages of Patient  Logbook. Stick patch monitor onto the last page of Patient Logbook.  Place Patient Logbook in the blue and white box. Use locking tab on box and tape box closed   securely. The blue and white box has prepaid postage on it. Please place it in the mailbox as  soon as possible. Your physician should have your test results approximately 7 days after the  monitor has been mailed back to [facility].  Call [facility] Customer Care at [phone] if you have questions regarding  your ZIO XT patch monitor. Call them immediately if you see an orange light blinking on your  monitor.  If your monitor falls off in less than 4 days, contact our Monitor department at [phone].  If your monitor becomes loose or falls off after 4 days call [employer] at [phone] for  suggestions on securing your monitor

## 2023-10-09 ENCOUNTER — Ambulatory Visit: Payer: Self-pay | Admitting: Cardiology

## 2023-10-09 DIAGNOSIS — R0602 Shortness of breath: Secondary | ICD-10-CM

## 2023-10-09 LAB — PRO B NATRIURETIC PEPTIDE: NT-Pro BNP: <36 pg/mL (ref 0–249)

## 2023-10-28 DIAGNOSIS — R002 Palpitations: Secondary | ICD-10-CM | POA: Diagnosis not present

## 2023-10-31 ENCOUNTER — Ambulatory Visit (HOSPITAL_COMMUNITY)
Admission: RE | Admit: 2023-10-31 | Discharge: 2023-10-31 | Disposition: A | Discharge status: Home / Self Care | Source: Ambulatory Visit | Attending: Cardiology | Admitting: Cardiology

## 2023-10-31 DIAGNOSIS — R0602 Shortness of breath: Secondary | ICD-10-CM | POA: Insufficient documentation

## 2023-10-31 LAB — ECHOCARDIOGRAM COMPLETE
Area-P 1/2: 6.54 cm2
S' Lateral: 2.6 cm

## 2023-10-31 NOTE — Progress Notes (Signed)
*  PRELIMINARY RESULTS* Echocardiogram 2D Echocardiogram has been performed.  Makayla Wilson 10/31/2023, 9:27 AM
